# Patient Record
Sex: Female | Born: 1970
Health system: Southern US, Community
[De-identification: ages and names within clinical notes are randomized; demographics above are authoritative.]

## PROBLEM LIST (undated history)

## (undated) ENCOUNTER — Emergency Department (HOSPITAL_BASED_OUTPATIENT_CLINIC_OR_DEPARTMENT_OTHER): Admission: EM | Payer: 59 | Source: Home / Self Care

## (undated) DIAGNOSIS — K219 Gastro-esophageal reflux disease without esophagitis: Secondary | ICD-10-CM

## (undated) DIAGNOSIS — R51 Headache: Secondary | ICD-10-CM

## (undated) DIAGNOSIS — F329 Major depressive disorder, single episode, unspecified: Secondary | ICD-10-CM

## (undated) DIAGNOSIS — T7840XA Allergy, unspecified, initial encounter: Secondary | ICD-10-CM

## (undated) DIAGNOSIS — D649 Anemia, unspecified: Secondary | ICD-10-CM

## (undated) DIAGNOSIS — R7303 Prediabetes: Secondary | ICD-10-CM

## (undated) DIAGNOSIS — F419 Anxiety disorder, unspecified: Secondary | ICD-10-CM

## (undated) DIAGNOSIS — I10 Essential (primary) hypertension: Secondary | ICD-10-CM

## (undated) DIAGNOSIS — F32A Depression, unspecified: Secondary | ICD-10-CM

## (undated) DIAGNOSIS — G4730 Sleep apnea, unspecified: Secondary | ICD-10-CM

## (undated) HISTORY — DX: Allergy, unspecified, initial encounter: T78.40XA

## (undated) HISTORY — DX: Sleep apnea, unspecified: G47.30

## (undated) HISTORY — DX: Essential (primary) hypertension: I10

## (undated) HISTORY — DX: Prediabetes: R73.03

## (undated) HISTORY — DX: Anxiety disorder, unspecified: F41.9

## (undated) HISTORY — DX: Morbid (severe) obesity due to excess calories: E66.01

## (undated) HISTORY — DX: Major depressive disorder, single episode, unspecified: F32.9

## (undated) HISTORY — DX: Depression, unspecified: F32.A

---
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
## 2004-07-20 ENCOUNTER — Other Ambulatory Visit: Admission: RE | Admit: 2004-07-20 | Discharge: 2004-07-20 | Payer: Self-pay | Admitting: Obstetrics and Gynecology

## 2005-07-26 ENCOUNTER — Other Ambulatory Visit: Admission: RE | Admit: 2005-07-26 | Discharge: 2005-07-26 | Payer: Self-pay | Admitting: Obstetrics and Gynecology

## 2012-02-18 ENCOUNTER — Other Ambulatory Visit: Payer: Self-pay | Admitting: Obstetrics and Gynecology

## 2012-02-18 DIAGNOSIS — Z1231 Encounter for screening mammogram for malignant neoplasm of breast: Secondary | ICD-10-CM

## 2012-02-25 ENCOUNTER — Ambulatory Visit
Admission: RE | Admit: 2012-02-25 | Discharge: 2012-02-25 | Disposition: A | Discharge status: Home / Self Care | Payer: 59 | Source: Ambulatory Visit | Attending: Obstetrics and Gynecology | Admitting: Obstetrics and Gynecology

## 2012-02-25 DIAGNOSIS — Z1231 Encounter for screening mammogram for malignant neoplasm of breast: Secondary | ICD-10-CM

## 2012-02-26 ENCOUNTER — Other Ambulatory Visit: Payer: Self-pay | Admitting: Obstetrics and Gynecology

## 2012-02-26 DIAGNOSIS — Z1231 Encounter for screening mammogram for malignant neoplasm of breast: Secondary | ICD-10-CM

## 2012-05-22 ENCOUNTER — Emergency Department (HOSPITAL_BASED_OUTPATIENT_CLINIC_OR_DEPARTMENT_OTHER): Payer: 59

## 2012-05-22 ENCOUNTER — Encounter (HOSPITAL_BASED_OUTPATIENT_CLINIC_OR_DEPARTMENT_OTHER): Payer: Self-pay | Admitting: Student

## 2012-05-22 ENCOUNTER — Emergency Department (HOSPITAL_BASED_OUTPATIENT_CLINIC_OR_DEPARTMENT_OTHER)
Admission: EM | Admit: 2012-05-22 | Discharge: 2012-05-22 | Disposition: A | Discharge status: Home / Self Care | Payer: 59 | Attending: Emergency Medicine | Admitting: Emergency Medicine

## 2012-05-22 DIAGNOSIS — I517 Cardiomegaly: Secondary | ICD-10-CM

## 2012-05-22 DIAGNOSIS — R0602 Shortness of breath: Secondary | ICD-10-CM | POA: Insufficient documentation

## 2012-05-22 DIAGNOSIS — I10 Essential (primary) hypertension: Secondary | ICD-10-CM | POA: Insufficient documentation

## 2012-05-22 LAB — BASIC METABOLIC PANEL
BUN: 8 mg/dL (ref 6–23)
CO2: 29 mEq/L (ref 19–32)
Calcium: 9.3 mg/dL (ref 8.4–10.5)
Creatinine, Ser: 0.9 mg/dL (ref 0.50–1.10)
Glucose, Bld: 95 mg/dL (ref 70–99)

## 2012-05-22 LAB — CBC WITH DIFFERENTIAL/PLATELET
Eosinophils Relative: 1 % (ref 0–5)
HCT: 36.1 % (ref 36.0–46.0)
Lymphocytes Relative: 39 % (ref 12–46)
Lymphs Abs: 3.4 10*3/uL (ref 0.7–4.0)
MCV: 86.4 fL (ref 78.0–100.0)
Monocytes Absolute: 0.5 10*3/uL (ref 0.1–1.0)
RBC: 4.18 MIL/uL (ref 3.87–5.11)
RDW: 14.6 % (ref 11.5–15.5)
WBC: 8.5 10*3/uL (ref 4.0–10.5)

## 2012-05-22 MED ORDER — LISINOPRIL 10 MG PO TABS: 10.0000 mg | ORAL_TABLET | Freq: Every day | ORAL | Status: DC

## 2012-05-22 NOTE — ED Notes (Signed)
Patient transported to X-ray 

## 2012-05-22 NOTE — ED Provider Notes (Signed)
Medical screening examination/treatment/procedure(s) were performed by non-physician practitioner and as supervising physician I was immediately available for consultation/collaboration.  Sharika Mosquera T Jahmar Mckelvy, MD 05/22/12 2220 

## 2012-05-22 NOTE — ED Provider Notes (Signed)
History     CSN: [id number]  Arrival date & time 05/22/12  1617   First MD Initiated Contact with Patient 05/22/12 1724      Chief Complaint  Patient presents with  . Shortness of Breath  . Cough  . Hypertension    (Consider location/radiation/quality/duration/timing/severity/associated sxs/prior treatment) HPI Comments: Pt states that she has had a cough in the last couple of days  Patient is a 41 y.o. female presenting with shortness of breath. The history is provided by the patient. No language interpreter was used.  Shortness of Breath  The current episode started more than 2 weeks ago. The onset was gradual. The problem occurs continuously. The problem has been gradually worsening. The problem is moderate. Nothing relieves the symptoms. The symptoms are aggravated by activity. Pertinent negatives include no chest pressure.    History reviewed. No pertinent past medical history.  Past Surgical History  Procedure Date  . Dilation and curettage of uterus     History reviewed. No pertinent family history.  History  Substance Use Topics  . Smoking status: Never Smoker   . Smokeless tobacco: Not on file  . Alcohol Use: No    OB History    Grav Para Term Preterm Abortions TAB SAB Ect Mult Living                  Review of Systems  Constitutional: Negative.   Respiratory: Negative.   Cardiovascular: Negative.     Allergies  Azithromycin  Home Medications  No current outpatient prescriptions on file.  BP 176/116  Pulse 93  Temp 99.2 F (37.3 C) (Oral)  Resp 20  Ht 5\' 1"  (1.549 m)  Wt 260 lb (117.935 kg)  BMI 49.13 kg/m2  SpO2 100%  LMP 05/08/2012  Physical Exam  Nursing note and vitals reviewed. Constitutional: She is oriented to person, place, and time. She appears well-developed and well-nourished.  Cardiovascular: Normal rate and regular rhythm.   Pulmonary/Chest: Breath sounds normal.  Abdominal: Soft. Bowel sounds are normal.    Musculoskeletal: Normal range of motion.  Neurological: She is alert and oriented to person, place, and time.    ED Course  Procedures (including critical care time)  Labs Reviewed  CBC WITH DIFFERENTIAL - Abnormal; Notable for the following:    Hemoglobin 11.3 (*)     All other components within normal limits  BASIC METABOLIC PANEL - Abnormal; Notable for the following:    GFR calc non Af Amer 79 (*)     All other components within normal limits  D-DIMER, QUANTITATIVE  TROPONIN I   Dg Chest 2 View  05/22/2012  *RADIOLOGY REPORT*  Clinical Data: Cough, shortness of breath, hypertension.  CHEST - 2 VIEW  Comparison: None.  Findings: Obesity.  Low lung volumes, with resultant crowding of bronchovascular markings.  No confluent airspace infiltrate or overt edema.  Heart size upper limits normal.  IMPRESSION:  1.  Borderline cardiomegaly   Original Report Authenticated By: D. Andria Rhein, MD     Date: 05/22/2012  Rate: 82  Rhythm: normal sinus rhythm and sinus arrhythmia  QRS Axis: normal  Intervals: normal  ST/T Wave abnormalities: normal  Conduction Disutrbances:none  Narrative Interpretation:   Old EKG Reviewed: none available    1. HTN (hypertension)       MDM  Pt started on lisinopril:pt not having any cp:not actively sob:pt given referral to set up for pcp        Teressa Lower, NP 05/22/12 1935

## 2012-05-22 NOTE — ED Notes (Signed)
Shortness of breath, cough, hypertension

## 2012-06-16 ENCOUNTER — Encounter: Payer: Self-pay | Admitting: Internal Medicine

## 2012-06-16 ENCOUNTER — Ambulatory Visit (INDEPENDENT_AMBULATORY_CARE_PROVIDER_SITE_OTHER): Payer: 59 | Admitting: Internal Medicine

## 2012-06-16 VITALS — BP 138/90 | HR 78 | Temp 98.5°F | Resp 16 | Ht 62.0 in | Wt 265.0 lb

## 2012-06-16 DIAGNOSIS — I10 Essential (primary) hypertension: Secondary | ICD-10-CM

## 2012-06-16 DIAGNOSIS — I499 Cardiac arrhythmia, unspecified: Secondary | ICD-10-CM | POA: Insufficient documentation

## 2012-06-16 DIAGNOSIS — R0609 Other forms of dyspnea: Secondary | ICD-10-CM

## 2012-06-16 DIAGNOSIS — G479 Sleep disorder, unspecified: Secondary | ICD-10-CM

## 2012-06-16 DIAGNOSIS — G4733 Obstructive sleep apnea (adult) (pediatric): Secondary | ICD-10-CM

## 2012-06-16 DIAGNOSIS — R0683 Snoring: Secondary | ICD-10-CM

## 2012-06-16 DIAGNOSIS — I495 Sick sinus syndrome: Secondary | ICD-10-CM

## 2012-06-16 NOTE — Progress Notes (Signed)
  Subjective:    Patient ID: Robin Cox, female    DOB: [birth year], 42 y.o.   MRN: [id number]  HPI  Oliver is a new pt here for first visit.   PMH of uterine fibroid, morbid obesity, and  hypertension recently started on Lisinopril  Pt not sure of dose. She reports she has been told in the past that her BP was high but "I was denial"   She was seen in ER at [facility]  See note.  CXR at that time show borderline cardiomegaly  She is  Concerned over possibilityof OSA.  She knows she snores and family members have told her that she stops breathing at night .  She also reports she hears herself wheezing at times  She has DOE since October of 2013.  No PND no LE edema  No chest pain, pressure or indigestion.    Allergies  Allergen Reactions  . Azithromycin    Past Medical History  Diagnosis Date  . Hypertension    Past Surgical History  Procedure Date  . Dilation and curettage of uterus    History   Social History  . Marital Status: Single    Spouse Name: N/A    Number of Children: N/A  . Years of Education: N/A   Occupational History  . Not on file.   Social History Main Topics  . Smoking status: Never Smoker   . Smokeless tobacco: Not on file  . Alcohol Use: No  . Drug Use: No  . Sexually Active: No   Other Topics Concern  . Not on file   Social History Narrative  . No narrative on file   Family History  Problem Relation Age of Onset  . Hypertension Brother   . Cancer Paternal Aunt     breast cancer  . Cancer Maternal Grandmother     pancreatic   There is no problem list on file for this patient.  Current Outpatient Prescriptions on File Prior to Visit  Medication Sig Dispense Refill  . lisinopril (PRINIVIL,ZESTRIL) 10 MG tablet Take 1 tablet (10 mg total) by mouth daily.  30 tablet  0      Review of Systems    see HPI Objective:   Physical Exam Physical Exam  Nursing note and vitals reviewed. Peak flow in office 500 Constitutional: She is oriented  to person, place, and time. She appears well-developed and well-nourished.  HENT:  Head: Normocephalic and atraumatic.  Cardiovascular: Normal rate and regular rhythm. Exam reveals no gallop and no friction rub.  No murmur heard.  Pulmonary/Chest: Breath sounds normal. She has no wheezes. She has no rales.  Neurological: She is alert and oriented to person, place, and time.  Skin: Skin is warm and dry.  Psychiatric: She has a normal mood and affect. Her behavior is normal.              Assessment & Plan:  HTN  Will continue current med  Check labs today.  EKG shows sinus arrythmia  Snoring sleep disturbance  Will refer to Pulmonary  Dr. Vassie Loll  Morbid obesity   Uterine fibroids  Schedule CPE and follow up with me in 4 weeks

## 2012-06-19 ENCOUNTER — Telehealth: Payer: Self-pay | Admitting: Internal Medicine

## 2012-06-19 NOTE — Telephone Encounter (Signed)
Patient was seen on Monday and needed to know if she was to continue on her lisinopril 10mg .  If so please send to [facility], [address] [place]in[place] Otto,   Call back number for patient is [phone].  wb

## 2012-06-19 NOTE — Telephone Encounter (Signed)
Robin Cox  Check with pharmacy and get correct dose of med.  OK to call in #[phone] with 1 refill

## 2012-06-20 ENCOUNTER — Other Ambulatory Visit: Payer: Self-pay | Admitting: *Deleted

## 2012-07-02 ENCOUNTER — Ambulatory Visit (INDEPENDENT_AMBULATORY_CARE_PROVIDER_SITE_OTHER): Payer: 59 | Admitting: Pulmonary Disease

## 2012-07-02 ENCOUNTER — Encounter: Payer: Self-pay | Admitting: Pulmonary Disease

## 2012-07-02 VITALS — BP 120/92 | HR 118 | Temp 99.2°F | Ht 62.0 in | Wt 273.0 lb

## 2012-07-02 DIAGNOSIS — R0989 Other specified symptoms and signs involving the circulatory and respiratory systems: Secondary | ICD-10-CM

## 2012-07-02 DIAGNOSIS — I10 Essential (primary) hypertension: Secondary | ICD-10-CM

## 2012-07-02 DIAGNOSIS — R0683 Snoring: Secondary | ICD-10-CM

## 2012-07-02 DIAGNOSIS — R0609 Other forms of dyspnea: Secondary | ICD-10-CM

## 2012-07-02 MED ORDER — OLMESARTAN MEDOXOMIL 20 MG PO TABS: 20.0000 mg | ORAL_TABLET | Freq: Every day | ORAL | Status: DC

## 2012-07-02 NOTE — Progress Notes (Signed)
Subjective:    Patient ID: Robin Cox, female    DOB: December 27, 1970, 42 y.o.   MRN: [id number]  HPI 42 year old this woman for evaluation of obstructive sleep apnea and cough. 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 She has been working the third shift for at least 15 years Per pt her family member has told her she stops breathing during sleep and she snores. Epworth sleepiness score is 9/24  She works from 9:30 PM to 6 AM and is also finishing school about 2-3 times a week. Bedtime is between 1 to 2 PM, sleep latency is about an hour, she sleeps on her side with one pillow. She is 2-3 awakenings including bathroom visit. She is out of bed by 6 PM with dryness of mouth feeling tired but denies headaches. On weekends she goes back to her likely sleep schedule of bedtime and 1 AM and is up around 8 AM she does nap on weekends. She's gained about 50 pounds in the last 2 years. She's tried to cut down on soda for the last 2 weeks and drinks one glass of tea daily. There is no history suggestive of cataplexy, sleep paralysis or parasomnias  Shows a reports dyspnea on exertion for 6 months. She had an ER visit in December 2013 for hypertensive crisis with a blood pressure 170/110. She was started on the supple 10 mg and good control of blood pressure but developed a dry cough with constant throat clearing in the last month. She also reports wheezing both at rest and on exertion. And for the last 2 months she has developed mild bipedal edema. Chest x-ray showed cardiomegaly without pulmonary vascular congestion or infiltrates  Past Medical History  Diagnosis Date  . Hypertension   . Allergy     Past Surgical History  Procedure Date  . Dilation and curettage of uterus     Allergies  Allergen Reactions  . Azithromycin    Family History  Problem Relation Age of Onset  . Hypertension Brother   . Breast cancer Paternal Aunt     breast cancer  . Pancreatic cancer Maternal  Grandmother     pancreatic  . Allergies Sister   . Allergies Brother     History   Social History  . Marital Status: Single    Spouse Name: N/A    Number of Children: N/A  . Years of Education: N/A   Occupational History  . Not on file.   Social History Main Topics  . Smoking status: Never Smoker   . Smokeless tobacco: Not on file  . Alcohol Use: No  . Drug Use: No  . Sexually Active: No   Other Topics Concern  . Not on file   Social History Narrative  . No narrative on file     Review of Systems  Constitutional: Negative for appetite change and unexpected weight change.  HENT: Positive for ear pain, congestion and sneezing. Negative for sore throat and trouble swallowing.   Respiratory: Positive for cough and shortness of breath.   Cardiovascular: Positive for palpitations and leg swelling. Negative for chest pain.  Gastrointestinal: Negative for abdominal pain.  Musculoskeletal: Negative for joint swelling.  Skin: Negative for rash.  Neurological: Negative for headaches.  Psychiatric/Behavioral: Negative for dysphoric mood. The patient is not nervous/anxious.        Objective:   Physical Exam  Gen. Pleasant, obese, in no distress, normal affect ENT - no lesions, no post nasal drip, class  2-3 airway Neck: No JVD, no thyromegaly, no carotid bruits Lungs: no use of accessory muscles, no dullness to percussion, decreased without rales or rhonchi  Cardiovascular: Rhythm regular, heart sounds  normal, no murmurs or gallops, no peripheral edema Abdomen: soft and non-tender, no hepatosplenomegaly, BS normal. Musculoskeletal: No deformities, no cyanosis or clubbing Neuro:  alert, non focal, no tremors       Assessment & Plan:

## 2012-07-02 NOTE — Patient Instructions (Signed)
Stop taking lisinopril - your cough is related to this Take benicar 20 mg instead - cough can take 6 weeks to subside Home sleep study If positive, will proceed with CPAP titration study in the lab

## 2012-07-02 NOTE — Assessment & Plan Note (Signed)
Stop taking lisinopril - your cough is related to this Take benicar 20 mg instead - cough can take 6 weeks to subside Unclear if dyspnea is also related to upper airway issues or simply deconditioning - doubt asthma, no risk factors for VTE

## 2012-07-02 NOTE — Assessment & Plan Note (Signed)
Given excessive daytime somnolence, narrow pharyngeal exam, witnessed apneas & loud snoring, obstructive sleep apnea is very likely & an overnight polysomnogram will be scheduled as a split study. The pathophysiology of obstructive sleep apnea , it's cardiovascular consequences & modes of treatment including CPAP were discused with the patient in detail & they evidenced understanding.   Home sleep study If positive, will proceed with CPAP titration study in the lab

## 2012-07-04 ENCOUNTER — Encounter: Payer: Self-pay | Admitting: *Deleted

## 2012-07-09 ENCOUNTER — Ambulatory Visit (INDEPENDENT_AMBULATORY_CARE_PROVIDER_SITE_OTHER): Payer: 59 | Admitting: Internal Medicine

## 2012-07-09 ENCOUNTER — Encounter: Payer: Self-pay | Admitting: Internal Medicine

## 2012-07-09 VITALS — BP 148/90 | HR 86 | Temp 97.4°F | Resp 18 | Ht 61.5 in | Wt 270.0 lb

## 2012-07-09 DIAGNOSIS — I10 Essential (primary) hypertension: Secondary | ICD-10-CM

## 2012-07-09 DIAGNOSIS — G4733 Obstructive sleep apnea (adult) (pediatric): Secondary | ICD-10-CM

## 2012-07-09 MED ORDER — OLMESARTAN MEDOXOMIL 20 MG PO TABS: 20.0000 mg | ORAL_TABLET | Freq: Every day | ORAL | Status: DC

## 2012-07-09 NOTE — Patient Instructions (Addendum)
See me in 3 weeks

## 2012-07-09 NOTE — Progress Notes (Signed)
  Subjective:    Patient ID: Robin Cox, female    DOB: 12-20-1970, 42 y.o.   MRN: [id number]  HPI Robin Cox is here for follow up.  She has a sleep study pending and has recently discontinued ACE inhibitor and switched to Benicar 20 mg.  She has been on Benicar for 3-4 days.  States cough is improving.   See BP  She is asymptomatic  Labs inclluding lipids done at [facility] look good  See scanned report  Allergies  Allergen Reactions  . Azithromycin    Past Medical History  Diagnosis Date  . Hypertension   . Allergy    Past Surgical History  Procedure Laterality Date  . Dilation and curettage of uterus     History   Social History  . Marital Status: Single    Spouse Name: N/A    Number of Children: N/A  . Years of Education: N/A   Occupational History  . Not on file.   Social History Main Topics  . Smoking status: Never Smoker   . Smokeless tobacco: Not on file  . Alcohol Use: No  . Drug Use: No  . Sexually Active: No   Other Topics Concern  . Not on file   Social History Narrative  . No narrative on file   Family History  Problem Relation Age of Onset  . Hypertension Brother   . Breast cancer Paternal Aunt     breast cancer  . Pancreatic cancer Maternal Grandmother     pancreatic  . Allergies Sister   . Allergies Brother    Patient Active Problem List  Diagnosis  . Essential hypertension, benign  . Arrhythmia, sinus node  . Morbid obesity  . OSA (obstructive sleep apnea)   Current Outpatient Prescriptions on File Prior to Visit  Medication Sig Dispense Refill  . olmesartan (BENICAR) 20 MG tablet Take 1 tablet (20 mg total) by mouth daily.  7 tablet  4   No current facility-administered medications on file prior to visit.       Review of Systems See HPI    Objective:   Physical Exam Physical Exam  Nursing note and vitals reviewed.  Constitutional: She is oriented to person, place, and time. She appears well-developed and well-nourished.   HENT:  Head: Normocephalic and atraumatic.  Cardiovascular: Normal rate and regular rhythm. Exam reveals no gallop and no friction rub.  No murmur heard.  Pulmonary/Chest: Breath sounds normal. She has no wheezes. She has no rales.  Neurological: She is alert and oriented to person, place, and time.  Skin: Skin is warm and dry.  Psychiatric: She has a normal mood and affect. Her behavior is normal.             Assessment & Plan:  HTN:  She has only been on Benicar 4 days.  Pt is to check outpt bp and bring log to me in 3 weeks.  If not at goal will add diuretic at that time  Morbid obesity  Does not want referral now  OSA  Sleep study pending  See me in 3 weeks

## 2012-07-10 ENCOUNTER — Ambulatory Visit: Payer: 59 | Admitting: Internal Medicine

## 2012-07-15 ENCOUNTER — Other Ambulatory Visit: Payer: Self-pay | Admitting: Pulmonary Disease

## 2012-07-15 DIAGNOSIS — G4733 Obstructive sleep apnea (adult) (pediatric): Secondary | ICD-10-CM

## 2012-07-17 ENCOUNTER — Telehealth: Payer: Self-pay | Admitting: *Deleted

## 2012-07-17 NOTE — Telephone Encounter (Signed)
Called concerned about RX

## 2012-07-22 ENCOUNTER — Institutional Professional Consult (permissible substitution): Payer: 59 | Admitting: Pulmonary Disease

## 2012-07-31 ENCOUNTER — Ambulatory Visit (INDEPENDENT_AMBULATORY_CARE_PROVIDER_SITE_OTHER): Payer: 59 | Admitting: Internal Medicine

## 2012-07-31 ENCOUNTER — Telehealth: Payer: Self-pay | Admitting: *Deleted

## 2012-07-31 ENCOUNTER — Encounter: Payer: Self-pay | Admitting: Internal Medicine

## 2012-07-31 VITALS — BP 130/80 | HR 82 | Temp 97.4°F | Resp 16 | Ht 61.0 in | Wt 266.0 lb

## 2012-07-31 DIAGNOSIS — I10 Essential (primary) hypertension: Secondary | ICD-10-CM

## 2012-07-31 DIAGNOSIS — G4733 Obstructive sleep apnea (adult) (pediatric): Secondary | ICD-10-CM

## 2012-07-31 NOTE — Progress Notes (Signed)
  Subjective:    Patient ID: Robin Cox, female    DOB: [birth year], 42 y.o.   MRN: [id number]  HPI [name] returns after initiating benicar.   States headaches are gone  No chest pain,  No cough no edema  Sleep study pending  Allergies  Allergen Reactions  . Azithromycin    Past Medical History  Diagnosis Date  . Hypertension   . Allergy    Past Surgical History  Procedure Laterality Date  . Dilation and curettage of uterus     History   Social History  . Marital Status: Single    Spouse Name: N/A    Number of Children: N/A  . Years of Education: N/A   Occupational History  . Not on file.   Social History Main Topics  . Smoking status: Never Smoker   . Smokeless tobacco: Not on file  . Alcohol Use: No  . Drug Use: No  . Sexually Active: No   Other Topics Concern  . Not on file   Social History Narrative  . No narrative on file   Family History  Problem Relation Age of Onset  . Hypertension Brother   . Breast cancer Paternal Aunt     breast cancer  . Pancreatic cancer Maternal Grandmother     pancreatic  . Allergies Sister   . Allergies Brother    Patient Active Problem List  Diagnosis  . Essential hypertension, benign  . Arrhythmia, sinus node  . Morbid obesity  . OSA (obstructive sleep apnea)   Current Outpatient Prescriptions on File Prior to Visit  Medication Sig Dispense Refill  . olmesartan (BENICAR) 20 MG tablet Take 1 tablet (20 mg total) by mouth daily.  30 tablet  1   No current facility-administered medications on file prior to visit.      Review of Systems    see HPI Objective:   Physical Exam  Physical Exam  Nursing note and vitals reviewed.  Constitutional: She is oriented to person, place, and time. She appears well-developed and well-nourished.  HENT:  Head: Normocephalic and atraumatic.  Cardiovascular: Normal rate and regular rhythm. Exam reveals no gallop and no friction rub.  No murmur heard.  Pulmonary/Chest:  Breath sounds normal. She has no wheezes. She has no rales.  Neurological: She is alert and oriented to person, place, and time.  Skin: Skin is warm and dry.  Psychiatric: She has a normal mood and affect. Her behavior is normal.  Ext  No edema       Assessment & Plan:  HTN:  Well controlled on Benicar 20 mg  OSA  Sleep study pending  Morbid obesity  Counseled regarding diet.   Does not want nutritional referral as yet

## 2012-07-31 NOTE — Patient Instructions (Addendum)
Keep appt with me For CPe

## 2012-08-03 ENCOUNTER — Encounter (HOSPITAL_BASED_OUTPATIENT_CLINIC_OR_DEPARTMENT_OTHER): Payer: 59

## 2012-08-04 NOTE — Telephone Encounter (Signed)
error 

## 2012-08-06 ENCOUNTER — Telehealth (HOSPITAL_BASED_OUTPATIENT_CLINIC_OR_DEPARTMENT_OTHER): Payer: Self-pay | Admitting: Radiology

## 2012-08-06 ENCOUNTER — Ambulatory Visit (HOSPITAL_BASED_OUTPATIENT_CLINIC_OR_DEPARTMENT_OTHER): Payer: 59

## 2012-08-06 NOTE — Telephone Encounter (Signed)
The technologist spoke with Dr. Vassie Loll regarding Robin Cox. [facility]/o nasal congestion and wheezing.At this time the patient was advised to reschedule the appointment due to illness. After speaking with Dr. Vassie Loll, he asked that the tech call and speak with [facility] tri[facility] to have the nurse give [name] a follow-up call regarding the concerns. The tech spoke with Victorino Dike at [facility] triage and she indicated that she would give Ms. call at the request of Dr. Vassie Loll...vlb

## 2012-09-04 ENCOUNTER — Ambulatory Visit (INDEPENDENT_AMBULATORY_CARE_PROVIDER_SITE_OTHER): Payer: 59 | Admitting: General Surgery

## 2012-09-05 ENCOUNTER — Telehealth: Payer: Self-pay | Admitting: Internal Medicine

## 2012-09-05 MED ORDER — OLMESARTAN MEDOXOMIL 20 MG PO TABS: 20.0000 mg | ORAL_TABLET | Freq: Every day | ORAL | Status: DC

## 2012-09-05 NOTE — Telephone Encounter (Signed)
benicar sent to [facility]

## 2012-09-05 NOTE — Telephone Encounter (Signed)
Pt states needs her olmesartan (BENICAR) 20 MG tablet Sent to [facility] out patient [facility]... She states she will be out of meds on Sunday... Pt was advise to make it 24 to 48 hours.. pts number [phone]

## 2012-09-07 ENCOUNTER — Ambulatory Visit (HOSPITAL_BASED_OUTPATIENT_CLINIC_OR_DEPARTMENT_OTHER): Payer: 59 | Attending: Pulmonary Disease

## 2012-09-07 VITALS — Ht 62.0 in | Wt 263.0 lb

## 2012-09-07 DIAGNOSIS — G4733 Obstructive sleep apnea (adult) (pediatric): Secondary | ICD-10-CM | POA: Insufficient documentation

## 2012-09-07 DIAGNOSIS — Z9989 Dependence on other enabling machines and devices: Secondary | ICD-10-CM

## 2012-09-10 ENCOUNTER — Telehealth: Payer: Self-pay | Admitting: Pulmonary Disease

## 2012-09-10 DIAGNOSIS — G4730 Sleep apnea, unspecified: Secondary | ICD-10-CM

## 2012-09-10 DIAGNOSIS — G4710 Hypersomnia, unspecified: Secondary | ICD-10-CM

## 2012-09-10 DIAGNOSIS — G4733 Obstructive sleep apnea (adult) (pediatric): Secondary | ICD-10-CM

## 2012-09-10 NOTE — Telephone Encounter (Signed)
PSG showed severe OSA I saw that she did not tolerate CPAP very well in th elab. I would like her to try this at home If agreeable, start on autoCPAP 8-13cm, med FF mask, humidity, download in 4 wks

## 2012-09-11 ENCOUNTER — Encounter: Payer: Self-pay | Admitting: Internal Medicine

## 2012-09-11 ENCOUNTER — Ambulatory Visit (INDEPENDENT_AMBULATORY_CARE_PROVIDER_SITE_OTHER): Payer: 59 | Admitting: Internal Medicine

## 2012-09-11 ENCOUNTER — Ambulatory Visit (INDEPENDENT_AMBULATORY_CARE_PROVIDER_SITE_OTHER): Payer: 59 | Admitting: General Surgery

## 2012-09-11 VITALS — BP 153/98 | HR 88 | Temp 98.3°F | Resp 18 | Ht 62.0 in | Wt 274.0 lb

## 2012-09-11 DIAGNOSIS — I10 Essential (primary) hypertension: Secondary | ICD-10-CM

## 2012-09-11 DIAGNOSIS — L03116 Cellulitis of left lower limb: Secondary | ICD-10-CM

## 2012-09-11 DIAGNOSIS — L02419 Cutaneous abscess of limb, unspecified: Secondary | ICD-10-CM

## 2012-09-11 DIAGNOSIS — R609 Edema, unspecified: Secondary | ICD-10-CM

## 2012-09-11 DIAGNOSIS — R600 Localized edema: Secondary | ICD-10-CM

## 2012-09-11 MED ORDER — CEFTRIAXONE SODIUM 1 G IJ SOLR
1.0000 g | INTRAMUSCULAR | Status: DC
  Administered 2012-09-11: 1 g via INTRAMUSCULAR

## 2012-09-11 MED ORDER — ALBUTEROL SULFATE (5 MG/ML) 0.5% IN NEBU: 5.0000 mg | INHALATION_SOLUTION | Freq: Once | RESPIRATORY_TRACT | Status: DC

## 2012-09-11 MED ORDER — AMOXICILLIN-POT CLAVULANATE 500-125 MG PO TABS: 1.0000 | ORAL_TABLET | Freq: Three times a day (TID) | ORAL | Status: DC

## 2012-09-11 MED ORDER — HYDROCHLOROTHIAZIDE 12.5 MG PO TABS: 25.0000 mg | ORAL_TABLET | Freq: Every day | ORAL | Status: DC

## 2012-09-11 NOTE — Progress Notes (Signed)
Subjective:    Patient ID: Robin Cox, female    DOB: Sep 08, 1970, 42 y.o.   MRN: [id number]  HPI  Robin Cox is here for CPE.    She had recent sleep study revealing sever OSA.   She will be initiating CPAP soon.    She tells me her cough has resumed and she is wheezing with exertion.  Cough is dry.  CXR  Shows borderline cardiomegaly  She has had evaluation at bariatric center and will be undergoing surgery in the future.    HTN  On BEnicar  Pt reports aprox 4 week history of redness and warmth of LLE.  Both leg swell but left worse than right.  She did not seek medical attention in last 4 weeks.  Allergies  Allergen Reactions  . Azithromycin    Past Medical History  Diagnosis Date  . Hypertension   . Allergy    Past Surgical History  Procedure Laterality Date  . Dilation and curettage of uterus     History   Social History  . Marital Status: Single    Spouse Name: N/A    Number of Children: N/A  . Years of Education: N/A   Occupational History  . Not on file.   Social History Main Topics  . Smoking status: Never Smoker   . Smokeless tobacco: Not on file  . Alcohol Use: No  . Drug Use: No  . Sexually Active: No   Other Topics Concern  . Not on file   Social History Narrative  . No narrative on file   Family History  Problem Relation Age of Onset  . Hypertension Brother   . Breast cancer Paternal Aunt     breast cancer  . Pancreatic cancer Maternal Grandmother     pancreatic  . Allergies Sister   . Allergies Brother    Patient Active Problem List  Diagnosis  . Essential hypertension, benign  . Arrhythmia, sinus node  . Morbid obesity  . OSA (obstructive sleep apnea)   Current Outpatient Prescriptions on File Prior to Visit  Medication Sig Dispense Refill  . olmesartan (BENICAR) 20 MG tablet Take 1 tablet (20 mg total) by mouth daily.  30 tablet  1   No current facility-administered medications on file prior to visit.      Review of  Systems  Respiratory: Positive for cough and shortness of breath.   Skin: Positive for color change and rash.       Objective:   Physical Exam Physical Exam  Physical Exam  Nursing note and vitals reviewed.  Constitutional: She is oriented to person, place, and time. She appears well-developed and well-nourished.  HENT:  Head: Normocephalic and atraumatic.  Right Ear: Tympanic membrane and ear canal normal. No drainage. Tympanic membrane is not injected and not erythematous.  Left Ear: Tympanic membrane and ear canal normal. No drainage. Tympanic membrane is not injected and not erythematous.  Nose: Nose normal. Right sinus exhibits no maxillary sinus tenderness and no frontal sinus tenderness. Left sinus exhibits no maxillary sinus tenderness and no frontal sinus tenderness.  Mouth/Throat: Oropharynx is clear and moist. No oral lesions. No oropharyngeal exudate.  Eyes: Conjunctivae and EOM are normal. Pupils are equal, round, and reactive to light.  Neck: Normal range of motion. Neck supple. No JVD present. Carotid bruit is not present. No mass and no thyromegaly present.  Cardiovascular: Normal rate, regular rhythm, S1 normal, S2 normal and intact distal pulses. Exam reveals no gallop and no friction  rub.  No murmur heard.  Pulses:  Carotid pulses are 2+ on the right side, and 2+ on the left side.  Dorsalis pedis pulses are 2+ on the right side, and 2+ on the left side.  No carotid bruit. No LE edema  Pulmonary/Chest: Breath sounds normal. She has no wheezes. She has no rales. She exhibits no tenderness.  Abdominal: Soft. Bowel sounds are normal. She exhibits no distension and no mass. There is no hepatosplenomegaly. There is no tenderness. There is no CVA tenderness.  Musculoskeletal: Normal range of motion.  No active synovitis to joints.  Lymphadenopathy:  She has no cervical adenopathy.  She has no axillary adenopathy.  Right: No inguinal and no supraclavicular adenopathy  present.  Left: No inguinal and no supraclavicular adenopathy present.  Neurological: She is alert and oriented to person, place, and time. She has normal strength and normal reflexes. She displays no tremor. No cranial nerve deficit or sensory deficit. Coordination and gait normal.  Skin: Skin is warm and dry. No rash noted. No cyanosis. Nails show no clubbing.  Ext:  L leg warm, erythematous  Consistent with cellulitis.  Good bilateral pedal pulses.  Edema worse on Left Psychiatric: She has a normal mood and affect. Her speech is normal and behavior is normal. Cognition and memory are normal.      Assessment & Plan:  Health Maintenance:    See scanned sheet.  Recent pap at GYN office.    HTN  Continue Benicar.  See below will add low dose HCTZ  LLE cellulitis  Will give Rocephin 1 gm in office today and Augmentin tid for 10 day course.  See me in office next week for check.  Bilateral LE edema  Will place on low dose HCTZ  SOB.  In setting of LE edema  Will check bilateral venous ultrasound although clinical suspicion higher for cellulitis.    Cough/bronchospasmL    has been chronic and predates  Benicar initiation .  Likely multifactorial morbid obesity certainly a contributor.  With cardiomegaly on CXR will discuss ECHO/EF eval. At her return visit next week.  Bronchospasm  much improved with Albuterel HHN in office.  Will give albuterol MDI that she can use bid, tid as outpt.   Severe OSA:   Pending treatment with CPAP  Dr. Vassie Loll   Morbid obesity.  P undergoing eval for gastric sleeve  Allergic rhinitis  Addendum:  Verbal report Friday am 4/18 venous ultrasound negative.         Assessment & Plan:

## 2012-09-11 NOTE — Procedures (Signed)
NAMEEVONNE, RINKS               ACCOUNT NO.:  [id number]  MEDICAL RECORD NO.:  [id number]          PATIENT TYPE:  OUT  LOCATION:  SLEEP CENTER                 FACILITY:  [facility]  PHYSICIAN:  Oretha Milch, MD      DATE OF BIRTH:  1970-07-06  DATE OF STUDY:  09/07/2012                           NOCTURNAL POLYSOMNOGRAM  REFERRING PHYSICIAN:  Oretha Milch, MD  INDICATION FOR STUDY:  Ms. Rarick is a 42 year old woman with hypertension, loud snoring, excessive daytime somnolence, trouble concentrating, and restless sleep.  At the time of this study, she weighed 263 pounds with a height of 5 feet 2 inches, BMI of 48, neck size of 17 inches.  Epworth sleepiness score of 11.  This intervention polysomnogram was performed with sleep technologist in attendance.  EEG, EOG, EMG, EKG, and respiratory parameters were recorded.  Sleep stages, arousals, limb movements, and respiratory data were scored according to criteria laid out by the American academy of Sleep Medicine.  SLEEP ARCHITECTURE:  Lights out was at 10:44 p.m.; lights on was at 5:15 a.m.  CPAP was initiated at 2:31 a.m.  During the diagnostic portion, total sleep time was 121 minutes with a sleep period time of 162 minutes with a sleep latency of sleep 65 minutes and sleep efficiency of 54%. Wake after sleep onset was 40 minutes.  Latency to REM sleep was 117 minutes.  Sleep stages as a percentage of total sleep time was N1 18%, N2 71%, N3 0%, REM sleep 11% (13 minutes).  Supine sleep was not noted. During the titration portion, REM sleep or supine sleep was not noted.  RESPIRATORY DATA:  During the diagnostic portion, there were 226 obstructive apneas, 0 central apneas, 0 mixed apneas, and 52 hypopneas with an apnea-hypopnea index of 137 events per hour and the lowest desaturation of 82%.  Due to this degree of respiratory disturbance, CPAP was initiated at 5 cm and titrated up only to a final level of 13 cm with a medium full  face mask.  She did not tolerate CPAP very well. At a level of 13 cm for 49 minutes, four obstructive apneas were noted with an AHI of 9 events per hour.  This appears to be the optimal level used during the study.  Although once again this was not well tolerated due to panic situation.  In the fact CPAP had to be decreased to 6 cm to allow her to go back to sleep and she slept propped up with 3 pillows.  LIMB MOVEMENT DATA:  No significant limb movements were noted.  AROUSAL DATA:  The arousal index was 1 events per hour during the diagnostic portion.  OXYGEN SATURATION DATA:  The lowest desaturation was 82%.  During the diagnostic portion of titration, the desaturation index was 1.6 events per hour and she spent 0 minutes with a saturation less than 88%.  CARDIAC DATA:  The low heart rate was 50 beats per minute.  The high heart rate recorded was an artifact.  No arrhythmias were noted.  IMPRESSION: 1. Severe obstructive sleep apnea with hypopneas causing sleep     fragmentation and oxygen desaturation. 2. This was corrected by  CPAP of 13 cm with a medium full face mask.     However, CPAP mask was poorly tolerated due to panic attacks. 3. Evidence of cardiac arrhythmias, limb movements, or behavioral     disturbance during sleep.  RECOMMENDATIONS: 1. She would benefit from a CPAP desensitization session. 2. Treatment options at this degree of sleep-disordered breathing     include weight loss and CPAP therapy. 3. CPAP should be initiated at 13 cm with a medium full face mask     alternatively.  Auto CPAP can be tried till she is able to adjust     with CPAP.  Compliance should be monitored at this level. 4. She should be advised against driving when sleepy.  She should be     cautioned against medications with sedative side effects.     Oretha Milch, MD    RVA/MEDQ  D:  09/10/2012 12:58:59  T:  09/11/2012 16:10:96  Job:  [id number]

## 2012-09-11 NOTE — Patient Instructions (Addendum)
Take HCTZ diuretic once a day  Use Albuterol inhaler 2 inhalations 3 times a day  Take antibiotic Augmentin 3 times a day    See me in one week or sooner if leg is worse  OK to use plain Claritin 24 hour .  One pill a day  for allergies  To go to lab today

## 2012-09-12 ENCOUNTER — Ambulatory Visit (HOSPITAL_BASED_OUTPATIENT_CLINIC_OR_DEPARTMENT_OTHER)
Admission: RE | Admit: 2012-09-12 | Discharge: 2012-09-12 | Disposition: A | Discharge status: Home / Self Care | Payer: 59 | Source: Ambulatory Visit | Attending: Internal Medicine | Admitting: Internal Medicine

## 2012-09-12 DIAGNOSIS — I10 Essential (primary) hypertension: Secondary | ICD-10-CM | POA: Insufficient documentation

## 2012-09-12 DIAGNOSIS — M79609 Pain in unspecified limb: Secondary | ICD-10-CM | POA: Insufficient documentation

## 2012-09-12 DIAGNOSIS — R609 Edema, unspecified: Secondary | ICD-10-CM | POA: Insufficient documentation

## 2012-09-12 DIAGNOSIS — E669 Obesity, unspecified: Secondary | ICD-10-CM | POA: Insufficient documentation

## 2012-09-12 DIAGNOSIS — R600 Localized edema: Secondary | ICD-10-CM

## 2012-09-13 ENCOUNTER — Encounter (HOSPITAL_BASED_OUTPATIENT_CLINIC_OR_DEPARTMENT_OTHER): Payer: Self-pay | Admitting: *Deleted

## 2012-09-13 ENCOUNTER — Emergency Department (HOSPITAL_BASED_OUTPATIENT_CLINIC_OR_DEPARTMENT_OTHER)
Admission: EM | Admit: 2012-09-13 | Discharge: 2012-09-13 | Disposition: A | Discharge status: Home / Self Care | Payer: 59 | Attending: Emergency Medicine | Admitting: Emergency Medicine

## 2012-09-13 ENCOUNTER — Emergency Department (HOSPITAL_BASED_OUTPATIENT_CLINIC_OR_DEPARTMENT_OTHER): Payer: 59

## 2012-09-13 DIAGNOSIS — R059 Cough, unspecified: Secondary | ICD-10-CM | POA: Insufficient documentation

## 2012-09-13 DIAGNOSIS — R0602 Shortness of breath: Secondary | ICD-10-CM

## 2012-09-13 DIAGNOSIS — L02419 Cutaneous abscess of limb, unspecified: Secondary | ICD-10-CM | POA: Insufficient documentation

## 2012-09-13 DIAGNOSIS — I10 Essential (primary) hypertension: Secondary | ICD-10-CM | POA: Insufficient documentation

## 2012-09-13 DIAGNOSIS — R05 Cough: Secondary | ICD-10-CM

## 2012-09-13 DIAGNOSIS — L03116 Cellulitis of left lower limb: Secondary | ICD-10-CM

## 2012-09-13 DIAGNOSIS — Z79899 Other long term (current) drug therapy: Secondary | ICD-10-CM | POA: Insufficient documentation

## 2012-09-13 LAB — CBC
HCT: 36.8 % (ref 36.0–46.0)
Hemoglobin: 11.9 g/dL — ABNORMAL LOW (ref 12.0–15.0)
MCH: 26.8 pg (ref 26.0–34.0)
MCHC: 32.3 g/dL (ref 30.0–36.0)

## 2012-09-13 LAB — BASIC METABOLIC PANEL
BUN: 9 mg/dL (ref 6–23)
Calcium: 9.1 mg/dL (ref 8.4–10.5)
GFR calc non Af Amer: 69 mL/min — ABNORMAL LOW (ref >90)
Glucose, Bld: 96 mg/dL (ref 70–99)

## 2012-09-13 MED ORDER — ACETAMINOPHEN 325 MG PO TABS
ORAL_TABLET | ORAL | Status: AC
  Administered 2012-09-13: 650 mg via ORAL
  Filled 2012-09-13: qty 2

## 2012-09-13 MED ORDER — ACETAMINOPHEN 325 MG PO TABS
650.0000 mg | ORAL_TABLET | Freq: Once | ORAL | Status: AC
  Administered 2012-09-13: 650 mg via ORAL

## 2012-09-13 MED ORDER — IOHEXOL 350 MG/ML SOLN
100.0000 mL | Freq: Once | INTRAVENOUS | Status: AC | PRN
  Administered 2012-09-13: 100 mL via INTRAVENOUS

## 2012-09-13 MED ORDER — ALBUTEROL SULFATE (5 MG/ML) 0.5% IN NEBU
5.0000 mg | INHALATION_SOLUTION | Freq: Once | RESPIRATORY_TRACT | Status: AC
  Administered 2012-09-13: 5 mg via RESPIRATORY_TRACT
  Filled 2012-09-13: qty 1

## 2012-09-13 MED ORDER — CLINDAMYCIN HCL 150 MG PO CAPS: 300.0000 mg | ORAL_CAPSULE | Freq: Three times a day (TID) | ORAL | Status: DC

## 2012-09-13 MED ORDER — IBUPROFEN 800 MG PO TABS
800.0000 mg | ORAL_TABLET | Freq: Once | ORAL | Status: AC
  Administered 2012-09-13: 800 mg via ORAL
  Filled 2012-09-13: qty 1

## 2012-09-13 MED ORDER — IPRATROPIUM BROMIDE 0.02 % IN SOLN
0.5000 mg | Freq: Once | RESPIRATORY_TRACT | Status: AC
  Administered 2012-09-13: 0.5 mg via RESPIRATORY_TRACT
  Filled 2012-09-13: qty 2.5

## 2012-09-13 MED ORDER — ALBUTEROL SULFATE HFA 108 (90 BASE) MCG/ACT IN AERS
2.0000 | INHALATION_SPRAY | RESPIRATORY_TRACT | Status: DC | PRN
  Filled 2012-09-13 (×2): qty 6.7

## 2012-09-13 NOTE — ED Notes (Addendum)
Patient c/o sob since last night, currently on antibiotic for L lower leg swelling, states she had some diarrhea/vomited yesterday, used inhaler around 7am this morning, no relief

## 2012-09-13 NOTE — ED Provider Notes (Signed)
History     CSN: [id number]  Arrival date & time 09/13/12  4098   First MD Initiated Contact with Patient 09/13/12 [phone]      Chief Complaint  Patient presents with  . Shortness of Breath    (Consider location/radiation/quality/duration/timing/severity/associated sxs/prior treatment) HPI Pt presenting with c/o shortness of breath and cough.  Pt has had cough for the past several days.  She saw her doctor who started her on an albuterol inhaler.  Also she has been started on augmentin for a lower lower extremity cellulitis.  She states the redness and swelling of her legs are improved since starting antibiotics.  She also had an ultrasound which did not show evidence of DVT on left side.  States cough became more pronoounced today, she tried her albuterol inhaler without much relief.  Cough is nonproductive, no chest pain.  There are no other associated systemic symptoms, there are no other alleviating or modifying factors.   Past Medical History  Diagnosis Date  . Hypertension   . Allergy     Past Surgical History  Procedure Laterality Date  . Dilation and curettage of uterus      Family History  Problem Relation Age of Onset  . Hypertension Brother   . Breast cancer Paternal Aunt     breast cancer  . Pancreatic cancer Maternal Grandmother     pancreatic  . Allergies Sister   . Allergies Brother     History  Substance Use Topics  . Smoking status: Never Smoker   . Smokeless tobacco: Not on file  . Alcohol Use: No    OB History   Grav Para Term Preterm Abortions TAB SAB Ect Mult Living   1         0      Review of Systems ROS reviewed and all otherwise negative except for mentioned in HPI  Allergies  Azithromycin  Home Medications   Current Outpatient Rx  Name  Route  Sig  Dispense  Refill  . Loratadine (CLARITIN PO)   Oral   Take by mouth.         Marland Kitchen amoxicillin-clavulanate (AUGMENTIN) 500-125 MG per tablet   Oral   Take 1 tablet (500 mg total) by  mouth 3 (three) times daily.   42 tablet   0   . clindamycin (CLEOCIN) 150 MG capsule   Oral   Take 2 capsules (300 mg total) by mouth 3 (three) times daily.   7 capsule   0   . hydrochlorothiazide (HYDRODIURIL) 12.5 MG tablet   Oral   Take 2 tablets (25 mg total) by mouth daily.   30 tablet   1   . olmesartan (BENICAR) 20 MG tablet   Oral   Take 1 tablet (20 mg total) by mouth daily.   30 tablet   1     BP 121/67  Pulse 87  Temp(Src) 98.8 F (37.1 C) (Oral)  Resp 20  SpO2 89%  LMP 09/05/2012 Vitals reviweed Physical Exam Physical Examination: General appearance - alert, well appearing, and in no distress Mental status - alert, oriented to person, place, and time Eyes - no conjunctival injection, no scleral icterus Mouth - mucous membranes moist, pharynx normal without lesions Chest - clear to auscultation, no wheezes, rales or rhonchi, symmetric air entry, no increased respiratory effort Heart - normal rate, regular rhythm, normal S1, S2, no murmurs, rubs, clicks or gallops Abdomen - soft, nontender, nondistended, no masses or organomegaly Extremities - peripheral pulses  normal, 1+ pedal edema on right and 2+ edema on left lower extremity, no clubbing or cyanosis Skin - normal coloration and turgor except left lower extremity with mild erythema and warmth  ED Course  Procedures (including critical care time)   Date: 09/13/2012  Rate: 100  Rhythm: normal sinus rhythm  QRS Axis: normal  Intervals: normal  ST/T Wave abnormalities: normal  Conduction Disutrbances: none  Narrative Interpretation: unremarkable, no significant change compared to prior ekg of 05/22/12     10:18 AM chart reviewed and patient had no evidence of DVT on lower extremity duplex u/s performed yesterday.   2:20 PM all results discussed with patient at the bedside and questions answered to the best of my abiltiy.   Labs Reviewed  CBC - Abnormal; Notable for the following:    Hemoglobin  11.9 (*)    All other components within normal limits  BASIC METABOLIC PANEL - Abnormal; Notable for the following:    Sodium 134 (*)    GFR calc non Af Amer 69 (*)    GFR calc Af Amer 80 (*)    All other components within normal limits  D-DIMER, QUANTITATIVE - Abnormal; Notable for the following:    D-Dimer, Quant 0.59 (*)    All other components within normal limits  CULTURE, BLOOD (ROUTINE X 2)  PRO B NATRIURETIC PEPTIDE  TROPONIN I   Dg Chest 2 View  09/13/2012  *RADIOLOGY REPORT*  Clinical Data: Shortness of breath.  CHEST - 2 VIEW  Comparison: 05/22/2012  Findings: Cardiomegaly with low lung volumes.  No focal airspace opacities, effusions or edema.  No acute bony abnormality.  IMPRESSION: Cardiomegaly, low lung volumes.   Original Report Authenticated By: Charlett Nose, M.D.    Ct Angio Chest Pe W/cm &/or Wo Cm  09/13/2012  *RADIOLOGY REPORT*  Clinical Data: At the D-dimer  CT ANGIOGRAPHY CHEST  Technique:  Multidetector CT imaging of the chest using the standard protocol during bolus administration of intravenous contrast. Multiplanar reconstructed images including MIPs were obtained and reviewed to evaluate the vascular anatomy.  Contrast: OMNIPAQUE IOHEXOL 350 MG/ML SOLN  Comparison: Chest radiographs dated 09/13/2012  Findings: No evidence of pulmonary embolism.  The lungs are essentially clear.  Mild linear scarring in the left lower lobe. No pleural effusion or pneumothorax.  Visualized thyroid is unremarkable.  The heart is normal in size.  No pericardial effusion.  No suspicious mediastinal, hilar, or axillary lymphadenopathy.  Visualized upper abdomen is unremarkable.  Degenerative changes of the visualized thoracolumbar spine.  IMPRESSION: No evidence of pulmonary embolism.  No evidence of acute cardiopulmonary disease.   Original Report Authenticated By: Charline Bills, M.D.    US Venous Img Lower Bilateral  09/12/2012  *RADIOLOGY REPORT*  Clinical Data: Bilateral leg  pain and edema  VENOUS DUPLEX ULTRASOUND OF BILATERAL LOWER EXTREMITIES  Technique:  Gray-scale sonography with graded compression, as well as color Doppler and duplex ultrasound, were performed to evaluate the deep venous system of both lower extremities from the level of the common femoral vein through the popliteal and proximal calf veins.  Spectral Doppler was utilized to evaluate flow at rest and with distal augmentation maneuvers.  Comparison:  None.  Findings: Ultrasound examination bilateral lower extremity demonstrate the visualized deep veins to be patent with good augmentation and compressibility noted.  There is no evidence of deep vein thrombosis.  Limited visualization of the calf veins due to the patient's large body habitus and superficial subcutaneous edema bilaterally.  IMPRESSION:  No evidence of deep vein thrombosis bilateral lower extremity. Suboptimal exam as described above.   Original Report Authenticated By: Natasha Mead, M.D.      1. Cough   2. Shortness of breath   3. Cellulitis of left lower extremity       MDM  Pt presenting with c/o cough and shortness of breath.  Also left lower extremity cellulitis. She states the cellulitis is improving after being started on abx.  CXR reassuring, EKG and troponin negative.  D-dimer was mildly elevated so ct angio chest obtained.  Pt states she felt improved after albuterol neb.  Suspect bronchitis.  She was encouraged to continue albuterol q4 hours as well as abx for cellulitis.  Discussed return precautions at length with patient.  Discharged with strict return precautions.  Pt agreeable with plan.        Ethelda Chick, MD 09/13/12 [phone]

## 2012-09-16 NOTE — Telephone Encounter (Signed)
lmomtcb x1 for pt 

## 2012-09-17 ENCOUNTER — Telehealth: Payer: Self-pay | Admitting: *Deleted

## 2012-09-17 NOTE — Telephone Encounter (Signed)
lmomtcb x2 for pt 

## 2012-09-17 NOTE — Telephone Encounter (Signed)
Returned pt's call. LVM.

## 2012-09-17 NOTE — Telephone Encounter (Signed)
Message copied by Perry Mount L on Wed Sep 17, 2012  2:26 PM ------      Message from: [place], Herbert Seta L      Created: Mon Sep 15, 2012 11:55 AM      Regarding: Medication Questions      Contact: [phone]       Pt was seen here on Thursday.  She was seen in emergency room over weekend and has questions about medications she was prescribed.  Please call her back at [phone]. ------

## 2012-09-18 ENCOUNTER — Other Ambulatory Visit (INDEPENDENT_AMBULATORY_CARE_PROVIDER_SITE_OTHER): Payer: Self-pay

## 2012-09-18 ENCOUNTER — Ambulatory Visit (INDEPENDENT_AMBULATORY_CARE_PROVIDER_SITE_OTHER): Payer: 59 | Admitting: General Surgery

## 2012-09-18 ENCOUNTER — Encounter (INDEPENDENT_AMBULATORY_CARE_PROVIDER_SITE_OTHER): Payer: Self-pay | Admitting: General Surgery

## 2012-09-18 ENCOUNTER — Encounter: Payer: Self-pay | Admitting: *Deleted

## 2012-09-18 ENCOUNTER — Ambulatory Visit: Payer: 59 | Admitting: Internal Medicine

## 2012-09-18 ENCOUNTER — Telehealth: Payer: Self-pay | Admitting: *Deleted

## 2012-09-18 NOTE — Addendum Note (Signed)
Addended by: Tommie Sams on: 09/18/2012 02:25 PM   Modules accepted: Orders

## 2012-09-18 NOTE — Progress Notes (Signed)
Subjective:   morbid obesity  Patient ID: Robin Cox, female   DOB: [birth year], 42 y.o.   MRN: [id number]  HPI Patient is a very pleasant 42 year old female referred by Dr. Lynnae Prude for consideration for surgical treatment for morbid obesity. The patient states she was relatively normal weight through high school but then experienced gradual weight gain throughout her 77s. In her 30s when she began to work regularly in her stress increased she noted her weight really began to spiral out of control. She has been through many efforts at nonsurgical weight loss with diet and exercise programs over the last 10 years and has had temporary success losing up to 60 pounds at a time but then experiences progressive weight regain. She initially was against surgery and felt she could do it on her own. However recently she has developed significant health problems including sleep apnea and shortness of breath with exertion as well as hypertension and is very concerned about her long-term health going forward at her current weight. She has been to our initial information seminar and is interested in sleeve gastrectomy as she does not like the idea of her intestines being rerouted if she does not want for material or the injections.  Past Medical History  Diagnosis Date  . Hypertension   . Allergy   . Sleep apnea    Past Surgical History  Procedure Laterality Date  . Dilation and curettage of uterus     Current Outpatient Prescriptions  Medication Sig Dispense Refill  . clindamycin (CLEOCIN) 150 MG capsule Take 2 capsules (300 mg total) by mouth 3 (three) times daily.  7 capsule  0  . hydrochlorothiazide (HYDRODIURIL) 12.5 MG tablet Take 2 tablets (25 mg total) by mouth daily.  30 tablet  1  . Loratadine (CLARITIN PO) Take by mouth.      . olmesartan (BENICAR) 20 MG tablet Take 1 tablet (20 mg total) by mouth daily.  30 tablet  1  . VENTOLIN HFA 108 (90 BASE) MCG/ACT inhaler        No current  facility-administered medications for this visit.   Allergies  Allergen Reactions  . Azithromycin      Review of Systems  Constitutional: Positive for fatigue.  HENT: Negative.   Respiratory: Positive for choking and shortness of breath.   Cardiovascular: Positive for leg swelling. Negative for chest pain and palpitations.  Gastrointestinal: Negative.        Reflux, mild, not requiring medication  Genitourinary: Negative.   Neurological: Negative.   Hematological: Negative.        Objective:   Physical Exam General: Alert, morbidly obese African American female, in no distress Skin: Warm and dry without rash or infection. HEENT: No palpable masses or thyromegaly. Sclera nonicteric. Pupils equal round and reactive. Oropharynx clear. Lymph nodes: No cervical, supraclavicular, or inguinal nodes palpable. Lungs: Breath sounds clear and equal without increased work of breathing Cardiovascular: Regular rate and rhythm without murmur. No JVD or edema. Peripheral pulses intact. Abdomen: Nondistended. Soft and nontender. No masses palpable. No organomegaly. No palpable hernias. Extremities: 1+ bilateral lower extremity edema. No chronic venous stasis changes. Neurologic: Alert and fully oriented. Gait normal.    Assessment:     Progressive morbid obesity unresponsive to medical management presenting at a BMI of 50 comorbidities of obstructive sleep apnea, hypertension and lower extremity edema. I think she would have significant medical benefit from surgical weight loss. We discussed the surgical options available including gastric bypass, lap band and  sleeve gastrectomy. She prefers sleeve gastrectomy after discussion for the reasons above I think this is a very reasonable choice for her. We discussed the procedure in detail including its nature in recovery and expected results. We discussed complications including anesthetic risks, bleeding, leakage and infection and pulmonary embolus as  well as long-term risks of stenosis, reflux and others. She was given a complete consent form to review.    Plan:     We'll proceed with preoperative workup for sleeve gastrectomy including upper GI series, psych and nutrition evaluations. She is being fitted for CPAP. I would support her having a preoperative echocardiogram to to her history of lower extremity edema and worsening shortness of breath. We will see her back following her evaluation.

## 2012-09-18 NOTE — Telephone Encounter (Signed)
lmomtcb x3 for pt. Will send pt a letter and will send message to RA so he is aware as well.

## 2012-09-18 NOTE — Telephone Encounter (Signed)
I spoke with patient about results and she verbalized understanding and had no questions. Order sent to [facility] 

## 2012-09-18 NOTE — Telephone Encounter (Signed)
Pt was seen in the ED and has questions about her abx that they gave her assured pt that it was ok to stop the augmentin and start the clindomycin

## 2012-09-19 LAB — CULTURE, BLOOD (ROUTINE X 2): Culture: NO GROWTH

## 2012-09-23 ENCOUNTER — Encounter: Payer: Self-pay | Admitting: Internal Medicine

## 2012-09-23 ENCOUNTER — Telehealth: Payer: Self-pay | Admitting: Internal Medicine

## 2012-09-23 ENCOUNTER — Ambulatory Visit (INDEPENDENT_AMBULATORY_CARE_PROVIDER_SITE_OTHER): Payer: 59 | Admitting: Internal Medicine

## 2012-09-23 VITALS — BP 128/77 | HR 98 | Temp 98.7°F | Resp 18 | Wt 263.0 lb

## 2012-09-23 DIAGNOSIS — R0609 Other forms of dyspnea: Secondary | ICD-10-CM

## 2012-09-23 DIAGNOSIS — I10 Essential (primary) hypertension: Secondary | ICD-10-CM

## 2012-09-23 DIAGNOSIS — L02419 Cutaneous abscess of limb, unspecified: Secondary | ICD-10-CM

## 2012-09-23 DIAGNOSIS — R0989 Other specified symptoms and signs involving the circulatory and respiratory systems: Secondary | ICD-10-CM

## 2012-09-23 DIAGNOSIS — L03119 Cellulitis of unspecified part of limb: Secondary | ICD-10-CM

## 2012-09-23 DIAGNOSIS — R609 Edema, unspecified: Secondary | ICD-10-CM

## 2012-09-23 DIAGNOSIS — R0600 Dyspnea, unspecified: Secondary | ICD-10-CM | POA: Insufficient documentation

## 2012-09-23 DIAGNOSIS — L03116 Cellulitis of left lower limb: Secondary | ICD-10-CM | POA: Insufficient documentation

## 2012-09-23 LAB — BASIC METABOLIC PANEL
BUN: 11 mg/dL (ref 6–23)
CO2: 32 mEq/L (ref 19–32)
Calcium: 9.4 mg/dL (ref 8.4–10.5)
Chloride: 95 mEq/L — ABNORMAL LOW (ref 96–112)
Creat: 0.89 mg/dL (ref 0.50–1.10)
Glucose, Bld: 93 mg/dL (ref 70–99)
Potassium: 3.8 mEq/L (ref 3.5–5.3)
Sodium: 137 mEq/L (ref 135–145)

## 2012-09-23 MED ORDER — POTASSIUM CHLORIDE ER 10 MEQ PO TBCR: 10.0000 meq | EXTENDED_RELEASE_TABLET | Freq: Two times a day (BID) | ORAL | Status: DC

## 2012-09-23 MED ORDER — HYDROCHLOROTHIAZIDE 12.5 MG PO TABS: ORAL_TABLET | ORAL | Status: DC

## 2012-09-23 NOTE — Telephone Encounter (Signed)
Pt scheduled with Dr Jens Som Friday Oct 24, 2012 at 4:00pm.  Pt made aware of appt, date, time, location, & phone number by in office. KH

## 2012-09-23 NOTE — Progress Notes (Signed)
Subjective:    Patient ID: Robin Cox, female    DOB: [birth year], 42 y.o.   MRN: [id number]  HPI Naveya is here for follow up.  She has lost 11 lbs since last visit.  She was seen in ER on 4/19 for SOB. CArdiac work up negative and CT Angio neg for PE.    Augmentin was making pt nauseated so she was changed to clindamycin which she finished.  SOB has resolved and she has not used her albuterol inhaler in the past 3 days.  She has never had chest pain or pressure  Cellulitis of Left leg markedly improved.   She is now off antibiotics.    Morbid obesity.  She had initial evaluation with Dr. Johna Sheriff.  He would like cardiology approval prior to surgery.  She has opted for gastric sleeve sometime this summer   She does have cardiomegaly on CXR.    Allergies  Allergen Reactions  . Augmentin (Amoxicillin-Pot Clavulanate) Nausea And Vomiting  . Azithromycin    Past Medical History  Diagnosis Date  . Hypertension   . Allergy   . Sleep apnea    Past Surgical History  Procedure Laterality Date  . Dilation and curettage of uterus     History   Social History  . Marital Status: Single    Spouse Name: N/A    Number of Children: N/A  . Years of Education: N/A   Occupational History  . Not on file.   Social History Main Topics  . Smoking status: Never Smoker   . Smokeless tobacco: Never Used  . Alcohol Use: No  . Drug Use: No  . Sexually Active: No   Other Topics Concern  . Not on file   Social History Narrative  . No narrative on file   Family History  Problem Relation Age of Onset  . Hypertension Brother   . Allergies Brother   . Pancreatic cancer Maternal Grandmother     pancreatic  . Allergies Sister   . Hypertension Sister   . Breast cancer Maternal Aunt   . Allergies Sister    Patient Active Problem List   Diagnosis Date Noted  . Dyspnea 09/23/2012  . Cellulitis of leg, left 09/23/2012  . Essential hypertension, benign 06/16/2012  . Arrhythmia, sinus node  06/16/2012  . Morbid obesity 06/16/2012  . OSA (obstructive sleep apnea) 06/16/2012   Current Outpatient Prescriptions on File Prior to Visit  Medication Sig Dispense Refill  . Loratadine (CLARITIN PO) Take by mouth.      . olmesartan (BENICAR) 20 MG tablet Take 1 tablet (20 mg total) by mouth daily.  30 tablet  1  . VENTOLIN HFA 108 (90 BASE) MCG/ACT inhaler        No current facility-administered medications on file prior to visit.       Review of Systems See HPI    Objective:   Physical Exam Physical Exam  Nursing note and vitals reviewed.  Constitutional: She is oriented to person, place, and time. She appears well-developed and well-nourished.  HENT:  Head: Normocephalic and atraumatic.  Cardiovascular: Normal rate and regular rhythm. Exam reveals no gallop and no friction rub.  No murmur heard.  Pulmonary/Chest: Breath sounds normal. She has no wheezes. She has no rales.  Neurological: She is alert and oriented to person, place, and time.  Skin: Skin is warm and dry.  Psychiatric: She has a normal mood and affect. Her behavior is normal.  Ext:  L leg redness  improving.  All edema of bilateral extremities has resolved        Assessment & Plan:  LLE cellulitis  Off antibiotics marked improvement  SOB.  Markedly improved with 11 lb weight loss  Will refer to cardiology for ECHO and possible stress eval prior to undergoing a gastric sleeve.    Can use albuterol on prn basis  Bilateral LE edema  Will continue HCTZ low dose with K-dur 10 meq for now.    Check K today  HTN  Well controlled  On Benicar and HCTZ    Severe  OSA  Pending   CPAP  Titration with Dr. Vassie Loll

## 2012-09-23 NOTE — Patient Instructions (Addendum)
See me in 3 months

## 2012-09-25 ENCOUNTER — Encounter: Payer: Self-pay | Admitting: *Deleted

## 2012-10-03 ENCOUNTER — Telehealth: Payer: Self-pay | Admitting: *Deleted

## 2012-10-03 ENCOUNTER — Other Ambulatory Visit (INDEPENDENT_AMBULATORY_CARE_PROVIDER_SITE_OTHER): Payer: Self-pay | Admitting: General Surgery

## 2012-10-03 LAB — CBC WITH DIFFERENTIAL/PLATELET
Basos: 0 % (ref 0–3)
Eos: 2 % (ref 0–5)
Lymphs: 39 % (ref 14–46)
Monocytes: 6 % (ref 4–12)
Neutrophils Relative %: 53 % (ref 40–74)
RBC: 3.96 x10E6/uL (ref 3.77–5.28)
RDW: 15.2 % (ref 12.3–15.4)
WBC: 7.8 10*3/uL (ref 3.4–10.8)

## 2012-10-03 LAB — COMPREHENSIVE METABOLIC PANEL
Albumin/Globulin Ratio: 1.2 (ref 1.1–2.5)
Albumin: 3.8 g/dL (ref 3.5–5.5)
Alkaline Phosphatase: 61 IU/L (ref 39–117)
BUN/Creatinine Ratio: 11 (ref 9–23)
BUN: 11 mg/dL (ref 6–24)
Chloride: 98 mmol/L (ref 96–108)
GFR calc Af Amer: 84 mL/min/{1.73_m2} (ref >59)
GFR calc non Af Amer: 73 mL/min/{1.73_m2} (ref >59)
Total Bilirubin: 0.1 mg/dL (ref 0.0–1.2)

## 2012-10-03 LAB — HCG, QUANTITATIVE, PREGNANCY: hCG Quant: <2 m[IU]/mL

## 2012-10-03 NOTE — Telephone Encounter (Signed)
These need to be refilled with pt new pharmacy [facility]. Pt and pharmacy requesting a 90 day supply

## 2012-10-04 LAB — LIPID PANEL
Chol/HDL Ratio: 3 ratio units (ref 0.0–4.4)
Cholesterol, Total: 122 mg/dL (ref 100–199)
LDL Calculated: 71 mg/dL (ref 0–99)
Triglycerides: 52 mg/dL (ref 0–149)
VLDL Cholesterol Cal: 10 mg/dL (ref 5–40)

## 2012-10-05 MED ORDER — POTASSIUM CHLORIDE ER 10 MEQ PO TBCR: EXTENDED_RELEASE_TABLET | ORAL | Status: DC

## 2012-10-05 MED ORDER — OLMESARTAN MEDOXOMIL 20 MG PO TABS: 20.0000 mg | ORAL_TABLET | Freq: Every day | ORAL | Status: DC

## 2012-10-05 MED ORDER — HYDROCHLOROTHIAZIDE 25 MG PO TABS: 25.0000 mg | ORAL_TABLET | Freq: Every day | ORAL | Status: DC

## 2012-10-05 NOTE — Telephone Encounter (Signed)
Robin Cox  Call pt and let her her know that her potassium pill can be taken one time daily  (not bid)  Keep her follow up appt with me

## 2012-10-08 MED ORDER — OLMESARTAN MEDOXOMIL 20 MG PO TABS: 20.0000 mg | ORAL_TABLET | Freq: Every day | ORAL | Status: DC

## 2012-10-08 MED ORDER — HYDROCHLOROTHIAZIDE 25 MG PO TABS: 25.0000 mg | ORAL_TABLET | Freq: Every day | ORAL | Status: DC

## 2012-10-08 NOTE — Telephone Encounter (Signed)
Pt requested 10 days worth of medication until mail order can get her meds to her. reordered 14 days worth sent to local pharmacy

## 2012-10-09 ENCOUNTER — Other Ambulatory Visit (INDEPENDENT_AMBULATORY_CARE_PROVIDER_SITE_OTHER): Payer: Self-pay

## 2012-10-09 ENCOUNTER — Ambulatory Visit (HOSPITAL_COMMUNITY)
Admission: RE | Admit: 2012-10-09 | Discharge: 2012-10-09 | Disposition: A | Discharge status: Home / Self Care | Payer: 59 | Source: Ambulatory Visit | Attending: General Surgery | Admitting: General Surgery

## 2012-10-09 ENCOUNTER — Encounter: Payer: 59 | Attending: General Surgery | Admitting: *Deleted

## 2012-10-09 ENCOUNTER — Encounter: Payer: Self-pay | Admitting: *Deleted

## 2012-10-09 DIAGNOSIS — Z713 Dietary counseling and surveillance: Secondary | ICD-10-CM | POA: Insufficient documentation

## 2012-10-09 DIAGNOSIS — G4730 Sleep apnea, unspecified: Secondary | ICD-10-CM | POA: Insufficient documentation

## 2012-10-09 DIAGNOSIS — Z6841 Body Mass Index (BMI) 40.0 and over, adult: Secondary | ICD-10-CM | POA: Insufficient documentation

## 2012-10-09 DIAGNOSIS — E669 Obesity, unspecified: Secondary | ICD-10-CM | POA: Insufficient documentation

## 2012-10-09 DIAGNOSIS — I10 Essential (primary) hypertension: Secondary | ICD-10-CM | POA: Insufficient documentation

## 2012-10-09 NOTE — Patient Instructions (Signed)
   Follow Pre-Op Nutrition Goals to prepare for Gastric Sleeve Surgery.   Call the [facility] at [phone] once you have been given your surgery date to enrolled in the Pre-Op Nutrition Class. You will need to attend this nutrition class 3-4 weeks prior to your surgery.  

## 2012-10-09 NOTE — Progress Notes (Addendum)
  Pre-Op Assessment Visit:  Pre-Operative Sleeve Gastrectomy Surgery  Medical Nutrition Therapy:  Appt start time: 1145   End time:  1255.  Patient was seen on 10/09/2012 for Pre-Operative Sleeve Gastrectomy Nutrition Assessment. Assessment and letter of approval faxed to [facility] Bariatric Surgery Program coordinator on 10/09/2012.  Approval letter sent to [facility] and will be available in the chart under the media tab.  Handouts given during visit include:  Pre-Op Goals   Bariatric Surgery Protein Shakes  Bariatric Surgery Support Group Dates  Samples given during visit include:   Premier Protein Shake: 1 bottle Lot # [id number] Exp: 06/09/13  Patient to call for Pre-Op and Post-Op Nutrition Education at the [facility] when surgery is scheduled.

## 2012-10-24 ENCOUNTER — Institutional Professional Consult (permissible substitution): Payer: 59 | Admitting: Cardiology

## 2012-10-24 ENCOUNTER — Encounter: Payer: Self-pay | Admitting: Cardiology

## 2012-10-24 ENCOUNTER — Encounter: Payer: Self-pay | Admitting: *Deleted

## 2012-10-27 ENCOUNTER — Ambulatory Visit (INDEPENDENT_AMBULATORY_CARE_PROVIDER_SITE_OTHER): Payer: 59 | Admitting: Cardiology

## 2012-10-27 ENCOUNTER — Encounter: Payer: Self-pay | Admitting: Cardiology

## 2012-10-27 VITALS — BP 138/82 | HR 87 | Wt 269.0 lb

## 2012-10-27 DIAGNOSIS — R0600 Dyspnea, unspecified: Secondary | ICD-10-CM

## 2012-10-27 DIAGNOSIS — I10 Essential (primary) hypertension: Secondary | ICD-10-CM

## 2012-10-27 DIAGNOSIS — Z01810 Encounter for preprocedural cardiovascular examination: Secondary | ICD-10-CM | POA: Insufficient documentation

## 2012-10-27 NOTE — Assessment & Plan Note (Signed)
Blood pressure is controlled. Continue present medications. 

## 2012-10-27 NOTE — Patient Instructions (Addendum)
Your physician recommends that you schedule a follow-up appointment in: AS NEEDED PENDING TEST RESULTS  Your physician has requested that you have a stress echocardiogram. For further information please visit [email or web address]. Please follow instruction sheet as given.    

## 2012-10-27 NOTE — Assessment & Plan Note (Signed)
Plan stress echocardiogram preoperatively for risk stratification.

## 2012-10-27 NOTE — Progress Notes (Signed)
  HPI: 42 year old female for evaluation of dyspnea. CTA in April of 2014 showed no pulmonary embolus. Lower extremity Dopplers in April 2014 showed no DVT. Laboratories in April of 2014 showed a pro BNP of 10. Troponin negative. Laboratories in May of 2014 showed normal renal function. Liver functions normal. TSH normal. Hemoglobin 10.2. Patient describes increased dyspnea on exertion for approximately 10 months. This occurs with walking up stairs or extended distances. She also has orthopnea, PND and pedal edema. No chest pain or syncope. She is scheduled for gastric sleeve surgery and cardiology asked to evaluate preoperatively.  Current Outpatient Prescriptions  Medication Sig Dispense Refill  . hydrochlorothiazide (HYDRODIURIL) 25 MG tablet Take 1 tablet (25 mg total) by mouth daily. Take one tablet daily  14 tablet  0  . Loratadine (CLARITIN PO) Take 10 mg by mouth daily.       Marland Kitchen olmesartan (BENICAR) 20 MG tablet Take 1 tablet (20 mg total) by mouth daily.  14 tablet  0  . potassium chloride (K-DUR) 10 MEQ tablet Take one tablet daily  90 tablet  0  . VENTOLIN HFA 108 (90 BASE) MCG/ACT inhaler Inhale 2 puffs into the lungs as needed.        No current facility-administered medications for this visit.    Allergies  Allergen Reactions  . Augmentin (Amoxicillin-Pot Clavulanate) Nausea And Vomiting  . Azithromycin     Past Medical History  Diagnosis Date  . Hypertension   . Allergy   . Sleep apnea   . Morbid obesity     Past Surgical History  Procedure Laterality Date  . Dilation and curettage of uterus  1990    History   Social History  . Marital Status: Single    Spouse Name: N/A    Number of Children: N/A  . Years of Education: N/A   Occupational History  . Not on file.   Social History Main Topics  . Smoking status: Never Smoker   . Smokeless tobacco: Never Used  . Alcohol Use: Yes     Comment: Rare  . Drug Use: No  . Sexually Active: No   Other Topics Concern   . Not on file   Social History Narrative  . No narrative on file    Family History  Problem Relation Age of Onset  . Hypertension Brother   . Allergies Brother   . Pancreatic cancer Maternal Grandmother     pancreatic  . Allergies Sister   . Hypertension Sister   . Breast cancer Maternal Aunt   . Allergies Sister   . Stroke Brother     ROS: no fevers or chills, productive cough, hemoptysis, dysphasia, odynophagia, melena, hematochezia, dysuria, hematuria, rash, seizure activity, claudication. Remaining systems are negative.  Physical Exam:   Blood pressure 138/82, pulse 87, weight 269 lb (122.018 kg), last menstrual period 10/09/2012.  General:  Well developed/obese in NAD Skin warm/dry Patient not depressed No peripheral clubbing Back-normal HEENT-normal/normal eyelids Neck supple/normal carotid upstroke bilaterally; no bruits; no JVD; no thyromegaly chest - CTA/ normal expansion CV - RRR/normal S1 and S2; no murmurs, rubs or gallops;  PMI nondisplaced Abdomen -NT/ND, no HSM, no mass, + bowel sounds, no bruit 2+ femoral pulses, no bruits Ext-1+ edema, no chords, 2+ DP Neuro-grossly nonfocal  ECG 09/13/2012-sinus rhythm, right axis deviation, nonspecific T-wave changes.

## 2012-10-27 NOTE — Assessment & Plan Note (Signed)
Etiology unclear. Possibly related to weight, OSA, OHS. Given her upcoming surgery we will plan to proceed with risk stratification. Plan stress echocardiogram with contrast to evaluate LV function and to screen for coronary disease.

## 2012-11-07 ENCOUNTER — Other Ambulatory Visit (HOSPITAL_COMMUNITY): Payer: Self-pay | Admitting: Cardiology

## 2012-11-07 ENCOUNTER — Ambulatory Visit (HOSPITAL_COMMUNITY): Payer: 59

## 2012-11-07 ENCOUNTER — Encounter: Payer: Self-pay | Admitting: Cardiology

## 2012-11-07 ENCOUNTER — Ambulatory Visit (HOSPITAL_COMMUNITY): Payer: 59 | Attending: Cardiology | Admitting: Radiology

## 2012-11-07 DIAGNOSIS — I10 Essential (primary) hypertension: Secondary | ICD-10-CM

## 2012-11-07 DIAGNOSIS — R404 Transient alteration of awareness: Secondary | ICD-10-CM

## 2012-11-07 DIAGNOSIS — R0600 Dyspnea, unspecified: Secondary | ICD-10-CM

## 2012-11-07 DIAGNOSIS — Z01810 Encounter for preprocedural cardiovascular examination: Secondary | ICD-10-CM

## 2012-11-07 DIAGNOSIS — R0989 Other specified symptoms and signs involving the circulatory and respiratory systems: Secondary | ICD-10-CM | POA: Insufficient documentation

## 2012-11-07 DIAGNOSIS — R0609 Other forms of dyspnea: Secondary | ICD-10-CM | POA: Insufficient documentation

## 2012-11-07 DIAGNOSIS — I495 Sick sinus syndrome: Secondary | ICD-10-CM

## 2012-11-07 MED ORDER — PERFLUTREN PROTEIN A MICROSPH IV SUSP
3.5000 mL | Freq: Once | INTRAVENOUS | Status: AC
  Administered 2012-11-07: 3.5 mL via INTRAVENOUS

## 2012-11-07 MED ORDER — PERFLUTREN PROTEIN A MICROSPH IV SUSP: 0.5000 mL | Freq: Once | INTRAVENOUS | Status: DC

## 2012-11-07 NOTE — Progress Notes (Signed)
Stress Echocardiogram performed with optison.  

## 2012-11-11 ENCOUNTER — Ambulatory Visit: Payer: 59 | Admitting: Internal Medicine

## 2012-11-18 ENCOUNTER — Telehealth: Payer: Self-pay | Admitting: Cardiology

## 2012-11-18 NOTE — Telephone Encounter (Signed)
Spoke with pt, aware of stress test results. Forwarded to dr Johna Sheriff

## 2012-11-18 NOTE — Telephone Encounter (Signed)
New Problem  Pt is returning your call from yesterday.

## 2012-12-08 ENCOUNTER — Ambulatory Visit (INDEPENDENT_AMBULATORY_CARE_PROVIDER_SITE_OTHER): Payer: 59 | Admitting: Internal Medicine

## 2012-12-08 ENCOUNTER — Encounter: Payer: Self-pay | Admitting: Internal Medicine

## 2012-12-08 DIAGNOSIS — D649 Anemia, unspecified: Secondary | ICD-10-CM

## 2012-12-08 DIAGNOSIS — I10 Essential (primary) hypertension: Secondary | ICD-10-CM

## 2012-12-08 NOTE — Patient Instructions (Addendum)
Fax labs to me

## 2012-12-08 NOTE — Progress Notes (Signed)
Subjective:    Patient ID: Robin Cox, female    DOB: Feb 29, 1971, 42 y.o.   MRN: [id number]  HPI  Robin Cox is here for follow up.  She has her gastric sleeve surgery scheduled for early August  She did have pre-op cardiac eval with Dr. Jens Som.    She reports no chest pain,  No SOB  See labs  She is anemic.  She reports she does have heavy menses the first 2 days of her cycle.  Changes pad every 3 hours during the first few days.  There is no history of sickle cell or sickle trait  Allergies  Allergen Reactions  . Augmentin (Amoxicillin-Pot Clavulanate) Nausea And Vomiting  . Azithromycin    Past Medical History  Diagnosis Date  . Hypertension   . Allergy   . Sleep apnea   . Morbid obesity    Past Surgical History  Procedure Laterality Date  . Dilation and curettage of uterus  1990   History   Social History  . Marital Status: Single    Spouse Name: N/A    Number of Children: N/A  . Years of Education: N/A   Occupational History  . Not on file.   Social History Main Topics  . Smoking status: Never Smoker   . Smokeless tobacco: Never Used  . Alcohol Use: Yes     Comment: Rare  . Drug Use: No  . Sexually Active: No   Other Topics Concern  . Not on file   Social History Narrative  . No narrative on file   Family History  Problem Relation Age of Onset  . Hypertension Brother   . Allergies Brother   . Pancreatic cancer Maternal Grandmother     pancreatic  . Allergies Sister   . Hypertension Sister   . Breast cancer Maternal Aunt   . Allergies Sister   . Stroke Brother    Patient Active Problem List   Diagnosis Date Noted  . Anemia 12/08/2012  . Preop cardiovascular exam 10/27/2012  . Dyspnea 09/23/2012  . Cellulitis of leg, left 09/23/2012  . Essential hypertension, benign 06/16/2012  . Arrhythmia, sinus node 06/16/2012  . Morbid obesity 06/16/2012  . OSA (obstructive sleep apnea) 06/16/2012   Current Outpatient Prescriptions on File Prior  to Visit  Medication Sig Dispense Refill  . hydrochlorothiazide (HYDRODIURIL) 25 MG tablet Take 1 tablet (25 mg total) by mouth daily. Take one tablet daily  14 tablet  0  . Loratadine (CLARITIN PO) Take 10 mg by mouth daily.       Marland Kitchen olmesartan (BENICAR) 20 MG tablet Take 1 tablet (20 mg total) by mouth daily.  14 tablet  0  . potassium chloride (K-DUR) 10 MEQ tablet Take one tablet daily  90 tablet  0  . VENTOLIN HFA 108 (90 BASE) MCG/ACT inhaler Inhale 2 puffs into the lungs as needed.        No current facility-administered medications on file prior to visit.      Review of Systems    see HPI Objective:   Physical Exam Physical Exam  Nursing note and vitals reviewed.  Constitutional: She is oriented to person, place, and time. She appears well-developed and well-nourished.  HENT:  Head: Normocephalic and atraumatic.  Cardiovascular: Normal rate and regular rhythm. Exam reveals no gallop and no friction rub.  No murmur heard.  Pulmonary/Chest: Breath sounds normal. She has no wheezes. She has no rales.  Neurological: She is alert and oriented to person, place,  and time.  Skin: Skin is warm and dry.  Psychiatric: She has a normal mood and affect. Her behavior is normal.        Assessment & Plan:  Morbid obesity upcoming gastric sleeve  HTN  conitnue meds for now  Anemia  Will check b12 folate.  Will start Integra with vit C one daily.  Recheck today along with BMP.  [name] works for [employer] and she will get labs there.  She is counseled to fax results to me  [facility]

## 2012-12-18 ENCOUNTER — Encounter: Payer: 59 | Attending: General Surgery

## 2012-12-18 ENCOUNTER — Ambulatory Visit (INDEPENDENT_AMBULATORY_CARE_PROVIDER_SITE_OTHER): Payer: 59 | Admitting: General Surgery

## 2012-12-18 DIAGNOSIS — Z713 Dietary counseling and surveillance: Secondary | ICD-10-CM | POA: Insufficient documentation

## 2012-12-18 DIAGNOSIS — E669 Obesity, unspecified: Secondary | ICD-10-CM | POA: Insufficient documentation

## 2012-12-18 NOTE — Progress Notes (Signed)
Need orders please - pt coming for pre op THURS 12/25/12 - thank you

## 2012-12-19 ENCOUNTER — Encounter (HOSPITAL_COMMUNITY): Payer: Self-pay | Admitting: Pharmacy Technician

## 2012-12-19 ENCOUNTER — Ambulatory Visit (INDEPENDENT_AMBULATORY_CARE_PROVIDER_SITE_OTHER): Payer: 59 | Admitting: General Surgery

## 2012-12-22 ENCOUNTER — Other Ambulatory Visit (INDEPENDENT_AMBULATORY_CARE_PROVIDER_SITE_OTHER): Payer: Self-pay | Admitting: General Surgery

## 2012-12-25 ENCOUNTER — Encounter (INDEPENDENT_AMBULATORY_CARE_PROVIDER_SITE_OTHER): Payer: Self-pay | Admitting: General Surgery

## 2012-12-25 ENCOUNTER — Encounter (HOSPITAL_COMMUNITY): Payer: Self-pay

## 2012-12-25 ENCOUNTER — Encounter (HOSPITAL_COMMUNITY)
Admission: RE | Admit: 2012-12-25 | Discharge: 2012-12-25 | Disposition: A | Discharge status: Home / Self Care | Payer: 59 | Source: Ambulatory Visit | Attending: General Surgery | Admitting: General Surgery

## 2012-12-25 ENCOUNTER — Ambulatory Visit (INDEPENDENT_AMBULATORY_CARE_PROVIDER_SITE_OTHER): Payer: 59 | Admitting: General Surgery

## 2012-12-25 DIAGNOSIS — Z01812 Encounter for preprocedural laboratory examination: Secondary | ICD-10-CM | POA: Insufficient documentation

## 2012-12-25 HISTORY — DX: Anemia, unspecified: D64.9

## 2012-12-25 HISTORY — DX: Headache: R51

## 2012-12-25 LAB — COMPREHENSIVE METABOLIC PANEL
ALT: 14 U/L (ref 0–35)
Alkaline Phosphatase: 48 U/L (ref 39–117)
BUN: 15 mg/dL (ref 6–23)
CO2: 32 mEq/L (ref 19–32)
GFR calc Af Amer: 77 mL/min — ABNORMAL LOW (ref >90)
GFR calc non Af Amer: 67 mL/min — ABNORMAL LOW (ref >90)
Glucose, Bld: 93 mg/dL (ref 70–99)
Potassium: 4 mEq/L (ref 3.5–5.1)
Sodium: 132 mEq/L — ABNORMAL LOW (ref 135–145)
Total Bilirubin: 0.2 mg/dL — ABNORMAL LOW (ref 0.3–1.2)

## 2012-12-25 LAB — CBC WITH DIFFERENTIAL/PLATELET
Hemoglobin: 11.2 g/dL — ABNORMAL LOW (ref 12.0–15.0)
Lymphocytes Relative: 39 % (ref 12–46)
Lymphs Abs: 3.3 10*3/uL (ref 0.7–4.0)
MCV: 83.1 fL (ref 78.0–100.0)
Monocytes Relative: 6 % (ref 3–12)
Neutrophils Relative %: 54 % (ref 43–77)
Platelets: 312 10*3/uL (ref 150–400)
RBC: 4.21 MIL/uL (ref 3.87–5.11)
WBC: 8.6 10*3/uL (ref 4.0–10.5)

## 2012-12-25 NOTE — Patient Instructions (Signed)
YOUR SURGERY IS SCHEDULED AT [facility]  ON:   Tuesday  8/5  REPORT TO [facility] AT:  5:15 AM      PHONE # FOR SHORT STAY IS [phone]  DO NOT EAT OR DRINK ANYTHING AFTER MIDNIGHT THE NIGHT BEFORE YOUR SURGERY.  YOU MAY BRUSH YOUR TEETH, RINSE OUT YOUR MOUTH--BUT NO WATER, NO FOOD, NO CHEWING GUM, NO MINTS, NO CANDIES, NO CHEWING TOBACCO.  PLEASE TAKE THE FOLLOWING MEDICATIONS THE AM OF YOUR SURGERY WITH A FEW SIPS OF WATER:  USE YOUR ALBUTEROL INHALER   IF YOU USE INHALERS--USE YOUR INHALERS THE AM OF YOUR SURGERY AND BRING INHALERS TO THE HOSPITAL.     IF YOU HAVE SLEEP APNEA AND USE CPAP OR BIPAP--PLEASE BRING THE MASK AND THE TUBING.  DO NOT BRING YOUR MACHINE.  DO NOT BRING VALUABLES, MONEY, CREDIT CARDS.  DO NOT WEAR JEWELRY, MAKE-UP, NAIL POLISH AND NO METAL PINS OR CLIPS IN YOUR HAIR. CONTACT LENS, DENTURES / PARTIALS, GLASSES SHOULD NOT BE WORN TO SURGERY AND IN MOST CASES-HEARING AIDS WILL NEED TO BE REMOVED.  BRING YOUR GLASSES CASE, ANY EQUIPMENT NEEDED FOR YOUR CONTACT LENS. FOR PATIENTS ADMITTED TO THE HOSPITAL--CHECK OUT TIME THE DAY OF DISCHARGE IS 11:00 AM.  ALL INPATIENT ROOMS ARE PRIVATE - WITH BATHROOM, TELEPHONE, TELEVISION AND WIFI INTERNET.   FAILURE TO FOLLOW THESE INSTRUCTIONS MAY RESULT IN THE CANCELLATION OF YOUR SURGERY.   PATIENT SIGNATURE_________________________________

## 2012-12-25 NOTE — Pre-Procedure Instructions (Signed)
PT HAS EKG REPORT IN EPIC DONE 09/13/12 AND CXR REPORT IN EPIC FROM 10/09/12.

## 2012-12-25 NOTE — Progress Notes (Signed)
Chief complaint: Preop sleeve gastrectomy  History: Patient returns for her preop visit prior to planned laparoscopic sleeve gastrectomy for morbid obesity next week. She has completed her preoperative workup. We reviewed her psych and nutrition evaluations which were unremarkable. Upper GI series was normal. Lab work was unremarkable. We completely reviewed the consent form and all her questions were answered.  Past Medical History  Diagnosis Date  . Hypertension   . Allergy   . Sleep apnea   . Morbid obesity   . Shortness of breath oct 2013    pt given inhaler for use as needed for wheezing and sob-that has helped - no diagnosis of asthma or respiratory problems  . Headache(784.0)     h/a's prior to starting b/p medication - no problems since  . Anemia     pt on iron pills   Past Surgical History  Procedure Laterality Date  . Dilation and curettage of uterus  1990   Current Outpatient Prescriptions  Medication Sig Dispense Refill  . albuterol (PROVENTIL HFA;VENTOLIN HFA) 108 (90 BASE) MCG/ACT inhaler Inhale 2 puffs into the lungs every 6 (six) hours as needed for wheezing.      . Fe Fum-FePoly-Vit C-Vit B3 (INTEGRA) 62.5-62.5-40-3 MG CAPS Take by mouth. Every am      . hydrochlorothiazide (HYDRODIURIL) 25 MG tablet Take 25 mg by mouth every evening.      . loratadine (CLARITIN) 10 MG tablet Take 10 mg by mouth every evening.      . olmesartan (BENICAR) 20 MG tablet Take 20 mg by mouth every evening.      . potassium chloride (K-DUR,KLOR-CON) 10 MEQ tablet Take 10 mEq by mouth every evening.       No current facility-administered medications for this visit.   Allergies  Allergen Reactions  . Augmentin (Amoxicillin-Pot Clavulanate) Nausea And Vomiting  . Azithromycin Nausea And Vomiting    Got extremely hot    Exam: BP 124/68  Pulse 88  Resp 16  Ht 5' 1.5" (1.562 m)  Wt 262 lb 9.6 oz (119.115 kg)  BMI 48.82 kg/m2  LMP 12/13/2012 General: Morbidly obese otherwise  well-appearing [place] female Skin: Right ear infection HEENT: Sclerae not icteric. No masses Lungs: Clear equal breath sounds bilaterally Cardiac: Regular rate and rhythm. No edema. Abdomen: Soft and nontender. No discernible masses or organomegaly Extremities: No swelling or edema Neurologic: Alert and fully oriented. Gait normal.  Assessment and plan: Morbid obesity with comorbidities of obstructive sleep apnea and hypertension and history of lower trinity edema. She is on her preoperative diet. Procedure reviewed and all questions answered as above.

## 2012-12-30 ENCOUNTER — Encounter (HOSPITAL_COMMUNITY): Payer: Self-pay | Admitting: Certified Registered Nurse Anesthetist

## 2012-12-30 ENCOUNTER — Inpatient Hospital Stay (HOSPITAL_COMMUNITY): Payer: 59 | Admitting: Certified Registered Nurse Anesthetist

## 2012-12-30 ENCOUNTER — Encounter (HOSPITAL_COMMUNITY): Payer: Self-pay | Admitting: *Deleted

## 2012-12-30 ENCOUNTER — Encounter (HOSPITAL_COMMUNITY)
Admission: RE | Disposition: A | Discharge status: Home / Self Care | Payer: Self-pay | Source: Ambulatory Visit | Attending: General Surgery

## 2012-12-30 ENCOUNTER — Inpatient Hospital Stay (HOSPITAL_COMMUNITY)
Admission: RE | Admit: 2012-12-30 | Discharge: 2013-01-01 | DRG: 621 | Disposition: A | Discharge status: Home / Self Care | Payer: 59 | Source: Ambulatory Visit | Attending: General Surgery | Admitting: General Surgery

## 2012-12-30 DIAGNOSIS — Z6841 Body Mass Index (BMI) 40.0 and over, adult: Secondary | ICD-10-CM

## 2012-12-30 DIAGNOSIS — I10 Essential (primary) hypertension: Secondary | ICD-10-CM | POA: Diagnosis present

## 2012-12-30 DIAGNOSIS — Z01812 Encounter for preprocedural laboratory examination: Secondary | ICD-10-CM

## 2012-12-30 DIAGNOSIS — G4733 Obstructive sleep apnea (adult) (pediatric): Secondary | ICD-10-CM

## 2012-12-30 DIAGNOSIS — Z79899 Other long term (current) drug therapy: Secondary | ICD-10-CM

## 2012-12-30 HISTORY — PX: ESOPHAGOGASTRODUODENOSCOPY: SHX5428

## 2012-12-30 HISTORY — PX: LAPAROSCOPIC GASTRIC SLEEVE RESECTION: SHX5895

## 2012-12-30 LAB — HEMOGLOBIN AND HEMATOCRIT, BLOOD
HCT: 32.9 % — ABNORMAL LOW (ref 36.0–46.0)
Hemoglobin: 10.4 g/dL — ABNORMAL LOW (ref 12.0–15.0)

## 2012-12-30 SURGERY — GASTRECTOMY, SLEEVE, LAPAROSCOPIC
Anesthesia: General | Wound class: Clean Contaminated

## 2012-12-30 MED ORDER — SCOPOLAMINE 1 MG/3DAYS TD PT72
MEDICATED_PATCH | TRANSDERMAL | Status: DC | PRN
  Administered 2012-12-30: 1 via TRANSDERMAL

## 2012-12-30 MED ORDER — PROPOFOL 10 MG/ML IV BOLUS
INTRAVENOUS | Status: DC | PRN
  Administered 2012-12-30: 200 mg via INTRAVENOUS

## 2012-12-30 MED ORDER — MORPHINE SULFATE 2 MG/ML IJ SOLN
2.0000 mg | INTRAMUSCULAR | Status: DC | PRN
  Administered 2012-12-30: 4 mg via INTRAVENOUS
  Administered 2012-12-31: 2 mg via INTRAVENOUS
  Administered 2012-12-31: 4 mg via INTRAVENOUS
  Filled 2012-12-30: qty 1
  Filled 2012-12-30 (×2): qty 2

## 2012-12-30 MED ORDER — EPHEDRINE SULFATE 50 MG/ML IJ SOLN
INTRAMUSCULAR | Status: DC | PRN
  Administered 2012-12-30 (×4): 5 mg via INTRAVENOUS
  Administered 2012-12-30: 10 mg via INTRAVENOUS
  Administered 2012-12-30: 5 mg via INTRAVENOUS

## 2012-12-30 MED ORDER — KETAMINE HCL 10 MG/ML IJ SOLN
INTRAMUSCULAR | Status: DC | PRN
  Administered 2012-12-30: 20 mg via INTRAVENOUS

## 2012-12-30 MED ORDER — ROCURONIUM BROMIDE 100 MG/10ML IV SOLN
INTRAVENOUS | Status: DC | PRN
  Administered 2012-12-30: 5 mg via INTRAVENOUS
  Administered 2012-12-30: 50 mg via INTRAVENOUS
  Administered 2012-12-30: 10 mg via INTRAVENOUS

## 2012-12-30 MED ORDER — MORPHINE SULFATE 4 MG/ML IJ SOLN
INTRAMUSCULAR | Status: AC
  Administered 2012-12-30: 4 mg
  Filled 2012-12-30: qty 1

## 2012-12-30 MED ORDER — UNJURY CHICKEN SOUP POWDER: 2.0000 [oz_av] | Freq: Four times a day (QID) | ORAL | Status: DC

## 2012-12-30 MED ORDER — LACTATED RINGERS IV SOLN
INTRAVENOUS | Status: DC | PRN
  Administered 2012-12-30 (×2): via INTRAVENOUS

## 2012-12-30 MED ORDER — PROMETHAZINE HCL 25 MG/ML IJ SOLN: 6.2500 mg | INTRAMUSCULAR | Status: DC | PRN

## 2012-12-30 MED ORDER — DEXAMETHASONE SODIUM PHOSPHATE 10 MG/ML IJ SOLN
INTRAMUSCULAR | Status: DC | PRN
  Administered 2012-12-30: 10 mg via INTRAVENOUS

## 2012-12-30 MED ORDER — HEPARIN SODIUM (PORCINE) 5000 UNIT/ML IJ SOLN
5000.0000 [IU] | Freq: Three times a day (TID) | INTRAMUSCULAR | Status: DC
  Administered 2012-12-30 – 2013-01-01 (×5): 5000 [IU] via SUBCUTANEOUS
  Filled 2012-12-30 (×9): qty 1

## 2012-12-30 MED ORDER — UNJURY VANILLA POWDER: 2.0000 [oz_av] | Freq: Four times a day (QID) | ORAL | Status: DC

## 2012-12-30 MED ORDER — LEVOFLOXACIN IN D5W 750 MG/150ML IV SOLN
750.0000 mg | INTRAVENOUS | Status: DC
  Filled 2012-12-30: qty 150

## 2012-12-30 MED ORDER — HYDROMORPHONE HCL PF 1 MG/ML IJ SOLN
0.2500 mg | INTRAMUSCULAR | Status: DC | PRN
  Administered 2012-12-30 (×3): 0.25 mg via INTRAVENOUS

## 2012-12-30 MED ORDER — 0.9 % SODIUM CHLORIDE (POUR BTL) OPTIME
TOPICAL | Status: DC | PRN
  Administered 2012-12-30: 1000 mL

## 2012-12-30 MED ORDER — METOCLOPRAMIDE HCL 5 MG/ML IJ SOLN
INTRAMUSCULAR | Status: DC | PRN
  Administered 2012-12-30: 10 mg via INTRAVENOUS

## 2012-12-30 MED ORDER — ACETAMINOPHEN 160 MG/5ML PO SOLN
650.0000 mg | ORAL | Status: DC | PRN
  Filled 2012-12-30: qty 20.3

## 2012-12-30 MED ORDER — STERILE WATER FOR IRRIGATION IR SOLN
Status: DC | PRN
  Administered 2012-12-30: 1500 mL

## 2012-12-30 MED ORDER — PHENYLEPHRINE HCL 10 MG/ML IJ SOLN
INTRAMUSCULAR | Status: DC | PRN
  Administered 2012-12-30: 80 ug via INTRAVENOUS
  Administered 2012-12-30: 40 ug via INTRAVENOUS
  Administered 2012-12-30 (×3): 80 ug via INTRAVENOUS

## 2012-12-30 MED ORDER — LEVOFLOXACIN IN D5W 750 MG/150ML IV SOLN
750.0000 mg | INTRAVENOUS | Status: AC
  Administered 2012-12-30: 750 mg via INTRAVENOUS

## 2012-12-30 MED ORDER — LIDOCAINE HCL (CARDIAC) 20 MG/ML IV SOLN
INTRAVENOUS | Status: DC | PRN
  Administered 2012-12-30: 100 mg via INTRAVENOUS

## 2012-12-30 MED ORDER — FENTANYL CITRATE 0.05 MG/ML IJ SOLN
INTRAMUSCULAR | Status: DC | PRN
  Administered 2012-12-30 (×4): 50 ug via INTRAVENOUS

## 2012-12-30 MED ORDER — HEPARIN SODIUM (PORCINE) 5000 UNIT/ML IJ SOLN
5000.0000 [IU] | INTRAMUSCULAR | Status: AC
  Administered 2012-12-30: 5000 [IU] via SUBCUTANEOUS
  Filled 2012-12-30: qty 1

## 2012-12-30 MED ORDER — BUPIVACAINE-EPINEPHRINE PF 0.25-1:200000 % IJ SOLN
INTRAMUSCULAR | Status: DC | PRN
  Administered 2012-12-30: 20 mL

## 2012-12-30 MED ORDER — NEOSTIGMINE METHYLSULFATE 1 MG/ML IJ SOLN
INTRAMUSCULAR | Status: DC | PRN
  Administered 2012-12-30: 5 mg via INTRAVENOUS

## 2012-12-30 MED ORDER — KCL IN DEXTROSE-NACL 20-5-0.9 MEQ/L-%-% IV SOLN
INTRAVENOUS | Status: DC
  Administered 2012-12-30 – 2012-12-31 (×2): via INTRAVENOUS
  Administered 2012-12-31: 100 mL/h via INTRAVENOUS
  Filled 2012-12-30 (×7): qty 1000

## 2012-12-30 MED ORDER — GLYCOPYRROLATE 0.2 MG/ML IJ SOLN
INTRAMUSCULAR | Status: DC | PRN
  Administered 2012-12-30: 0.6 mg via INTRAVENOUS

## 2012-12-30 MED ORDER — LACTATED RINGERS IR SOLN
Status: DC | PRN
  Administered 2012-12-30: 1000 mL

## 2012-12-30 MED ORDER — MIDAZOLAM HCL 5 MG/5ML IJ SOLN
INTRAMUSCULAR | Status: DC | PRN
  Administered 2012-12-30: 2 mg via INTRAVENOUS

## 2012-12-30 MED ORDER — ONDANSETRON HCL 4 MG/2ML IJ SOLN
INTRAMUSCULAR | Status: DC | PRN
  Administered 2012-12-30: 4 mg via INTRAVENOUS

## 2012-12-30 MED ORDER — ALBUTEROL SULFATE HFA 108 (90 BASE) MCG/ACT IN AERS
INHALATION_SPRAY | RESPIRATORY_TRACT | Status: DC | PRN
  Administered 2012-12-30: 5 via RESPIRATORY_TRACT

## 2012-12-30 MED ORDER — SUCCINYLCHOLINE CHLORIDE 20 MG/ML IJ SOLN
INTRAMUSCULAR | Status: DC | PRN
  Administered 2012-12-30: 200 mg via INTRAVENOUS

## 2012-12-30 MED ORDER — ONDANSETRON HCL 4 MG/2ML IJ SOLN
4.0000 mg | INTRAMUSCULAR | Status: DC | PRN
  Administered 2012-12-30 – 2012-12-31 (×2): 4 mg via INTRAVENOUS
  Filled 2012-12-30 (×2): qty 2

## 2012-12-30 MED ORDER — PHENYLEPHRINE HCL 10 MG/ML IJ SOLN
10.0000 mg | INTRAVENOUS | Status: DC | PRN
  Administered 2012-12-30: 50 ug/min via INTRAVENOUS

## 2012-12-30 MED ORDER — UNJURY CHOCOLATE CLASSIC POWDER
2.0000 [oz_av] | Freq: Four times a day (QID) | ORAL | Status: DC
  Administered 2013-01-01: 2 [oz_av] via ORAL

## 2012-12-30 MED ORDER — OXYCODONE-ACETAMINOPHEN 5-325 MG/5ML PO SOLN
5.0000 mL | ORAL | Status: DC | PRN
  Administered 2012-12-31: 5 mL via ORAL
  Filled 2012-12-30: qty 5

## 2012-12-30 MED ORDER — TISSEEL VH 10 ML EX KIT
PACK | CUTANEOUS | Status: DC | PRN
  Administered 2012-12-30: 10 mL

## 2012-12-30 SURGICAL SUPPLY — 56 items
ADH SKN CLS APL DERMABOND .7 (GAUZE/BANDAGES/DRESSINGS) ×2
APL SRG 32X5 SNPLK LF DISP (MISCELLANEOUS) ×1
APPLICATOR COTTON TIP 6IN STRL (MISCELLANEOUS) IMPLANT
APPLIER CLIP ROT 10 11.4 M/L (STAPLE)
APPLIER CLIP ROT 13.4 12 LRG (CLIP) ×2
APR CLP LRG 13.4X12 ROT 20 MLT (CLIP) ×1
APR CLP MED LRG 11.4X10 (STAPLE)
BAG SPEC RTRVL LRG 6X4 10 (ENDOMECHANICALS)
CABLE HIGH FREQUENCY MONO STRZ (ELECTRODE) IMPLANT
CANISTER SUCTION 2500CC (MISCELLANEOUS) ×2 IMPLANT
CHLORAPREP W/TINT 26ML (MISCELLANEOUS) ×4 IMPLANT
CLIP APPLIE ROT 10 11.4 M/L (STAPLE) IMPLANT
CLIP APPLIE ROT 13.4 12 LRG (CLIP) IMPLANT
CLOTH BEACON ORANGE TIMEOUT ST (SAFETY) ×2 IMPLANT
DERMABOND ADVANCED (GAUZE/BANDAGES/DRESSINGS) ×2
DERMABOND ADVANCED .7 DNX12 (GAUZE/BANDAGES/DRESSINGS) IMPLANT
DEVICE SUTURE ENDOST 10MM (ENDOMECHANICALS) IMPLANT
DRAIN CHANNEL 19F RND (DRAIN) ×2 IMPLANT
DRAPE LAPAROSCOPIC ABDOMINAL (DRAPES) ×2 IMPLANT
DRAPE UTILITY XL STRL (DRAPES) ×4 IMPLANT
ELECT REM PT RETURN 9FT ADLT (ELECTROSURGICAL) ×2
ELECTRODE REM PT RTRN 9FT ADLT (ELECTROSURGICAL) ×1 IMPLANT
EVACUATOR DRAINAGE 10X20 100CC (DRAIN) ×1 IMPLANT
EVACUATOR SILICONE 100CC (DRAIN) ×4 IMPLANT
GLOVE SURG SS PI 7.5 STRL IVOR (GLOVE) ×4 IMPLANT
GOWN STRL REIN XL XLG (GOWN DISPOSABLE) ×8 IMPLANT
HOVERMATT SINGLE USE (MISCELLANEOUS) ×2 IMPLANT
KIT BASIN OR (CUSTOM PROCEDURE TRAY) ×2 IMPLANT
MARKER SKIN DUAL TIP RULER LAB (MISCELLANEOUS) ×2 IMPLANT
NDL SPNL 22GX3.5 QUINCKE BK (NEEDLE) ×1 IMPLANT
NEEDLE SPNL 22GX3.5 QUINCKE BK (NEEDLE) ×2 IMPLANT
NS IRRIG 1000ML POUR BTL (IV SOLUTION) ×2 IMPLANT
PENCIL BUTTON HOLSTER BLD 10FT (ELECTRODE) ×2 IMPLANT
POUCH SPECIMEN RETRIEVAL 10MM (ENDOMECHANICALS) IMPLANT
RELOAD BLUE (STAPLE) ×4 IMPLANT
RELOAD GREEN (STAPLE) ×1 IMPLANT
SCISSORS LAP 5X35 DISP (ENDOMECHANICALS) ×1 IMPLANT
SEALANT SURGICAL APPL DUAL CAN (MISCELLANEOUS) ×2 IMPLANT
SET IRRIG TUBING LAPAROSCOPIC (IRRIGATION / IRRIGATOR) ×2 IMPLANT
SHEARS CURVED HARMONIC AC 45CM (MISCELLANEOUS) ×2 IMPLANT
SLEEVE ENDOPATH XCEL 5M (ENDOMECHANICALS) ×6 IMPLANT
SOLUTION ANTI FOG 6CC (MISCELLANEOUS) ×2 IMPLANT
SPONGE GAUZE 4X4 12PLY (GAUZE/BANDAGES/DRESSINGS) IMPLANT
SPONGE LAP 18X18 X RAY DECT (DISPOSABLE) ×2 IMPLANT
STAPLE ECHEON FLEX 60 POW ENDO (STAPLE) IMPLANT
SUT ETHILON 2 0 PS N (SUTURE) ×1 IMPLANT
SUT MNCRL AB 4-0 PS2 18 (SUTURE) ×2 IMPLANT
SUT VICRYL 0 UR6 27IN ABS (SUTURE) ×1 IMPLANT
TOWEL OR 17X26 10 PK STRL BLUE (TOWEL DISPOSABLE) ×2 IMPLANT
TRAY FOLEY CATH 14FRSI W/METER (CATHETERS) ×2 IMPLANT
TRAY LAP CHOLE (CUSTOM PROCEDURE TRAY) ×2 IMPLANT
TROCAR BLADELESS 15MM (ENDOMECHANICALS) ×2 IMPLANT
TROCAR BLADELESS OPT 5 100 (ENDOMECHANICALS) ×3 IMPLANT
TUBING CONNECTING 10 (TUBING) ×2 IMPLANT
TUBING ENDO SMARTCAP (MISCELLANEOUS) ×2 IMPLANT
TUBING FILTER THERMOFLATOR (ELECTROSURGICAL) ×2 IMPLANT

## 2012-12-30 NOTE — Anesthesia Postprocedure Evaluation (Signed)
  Anesthesia Post-op Note  Patient: Robin Cox  Procedure(s) Performed: Procedure(s) (LRB): LAPAROSCOPIC GASTRIC SLEEVE RESECTION (N/A) ESOPHAGOGASTRODUODENOSCOPY (EGD) (N/A)  Patient Location: PACU  Anesthesia Type: General  Level of Consciousness: awake and alert   Airway and Oxygen Therapy: Patient Spontanous Breathing  Post-op Pain: mild  Post-op Assessment: Post-op Vital signs reviewed, Patient's Cardiovascular Status Stable, Respiratory Function Stable, Patent Airway and No signs of Nausea or vomiting  Last Vitals:  Filed Vitals:   12/30/12 1130  BP: 113/62  Pulse: 91  Temp:   Resp: 19    Post-op Vital Signs: stable   Complications: No apparent anesthesia complications. No observed apnea in PACU. OSA precautions on floor.

## 2012-12-30 NOTE — Op Note (Signed)
Preoperative Diagnosis: morbid obesity  Postoprative Diagnosis: morbid obesity  Procedure: Procedure(s): LAPAROSCOPIC GASTRIC SLEEVE RESECTION ESOPHAGOGASTRODUODENOSCOPY (EGD)   Surgeon: Glenna Fellows T   Assistants: Lodema Pilot  Anesthesia:  General endotracheal anesthesia  Indications: patient is a 42 year old female with progressive morbid obesity unresponsive to medical management who presents with a BMI of 48 and multiple comorbidities as detailed elsewhere in her record. After extensive preoperative workup and discussion of options and risks detailed elsewhere we have elected to proceed with laparoscopic sleeve gastrectomy for treatment of her morbid obesity.  Procedure Detail:  Patient was brought to the operating room, placed in the supine position on the operating table, and general endotracheal anesthesia induced. The abdomen was widely sterilely prepped and draped. She did receive preoperative IV antibiotics and subcutaneous heparin. PAS port in place. Patient time out was performed and correct procedure verified. Access was obtained with a 5 mm Optiview trocar in the left upper quadrant without difficulty and pneumoperitoneum established. Under direct vision a 5 mm trocar was placed laterally in the right upper quadrant, a 15 mm trocar in the right upper quadrant midclavicular line and a 5 mm trocar just to the left of the umbilicus for the camera port. There were noted to be adhesions to the left lobe of the liver which had it fixed up to the diaphragm providing actually excellent liver retraction with good exposure of the entire stomach and the liver retractor was necessary. The pylorus was identified and a measured 5 cm from the pylorus. At this point the lesser omentum was divided just inferior to the greater curve of the stomach with the harmonic scalpel. The free lesser sac was entered. We then progressed proximally along the greater curve of the stomach separating the lesser  omentum with Harmonic scalpel. The dissection progressed proximally and short gastric vessels were sequentially divided along the greater curve working up toward the angle of Hiss. The stomach was fairly closely applied to the spleen near the angle of Hiss which was dissected away with the harmonic scalpel. The fundus was completely freed and dissection carried up to the angle of Hiss and the left crus identified and the stomach completely dissected down and away from the left crus. Working back distally the stomach was completely free over to the lesser curve with minimal retrogastric adhesions. There were a few down along the antrum that were taken down completely mobilizing the stomach to the lesser curve. An initial firing of the green load echelon 60 mm stapler was used beginning 5 cm proximal to the pylorus and extending up toward but staying away from the incisura. Following this blue loads were used and a second stapler was applied and a 36 bougie dilator passed orally down through the stomach to the pylorus. The second stapler was fired at this point. Several more firings of the 60 mm blue load stapler were then used staying close to but not impinging upon the bougie making sure the staple line was straight and symmetric to the anterior and posterior stomach prior to firing. A final firing was used up through the angle of his staying just lateral to the esophageal fat pad and the specimen separated. The gastric tube was seen to be nicely symmetrical with a smooth staple line and no spiraling.  There was a tiny area of oozing right at the very top of the staple line at the angle of Hiss which was controlled with a single clip. There was no other bleeding. The specimen was removed  by sharply excising the staple line from the specimen and removing this with a 15 mm trocar. The remainder of the specimen was then cut in a spiral fashion and removed through the 15 mm trocar. The abdomen was thoroughly irrigated  there was no evidence of bleeding. Dr. Biagio Quint then performed upper endoscopy and with the pylorus occluded and under saline irrigation there was no evidence of leak. No evidence of bleeding. The gastric tube appeared symmetrical without stricture. The stomach was desufflated and the staple line coated with Tisseel tissue sealant. All CO2 was evacuated and trochars removed. Skin incisions were closed with subcuticular Monocryl and Dermabond. Sponge needle and instrument counts were correct.   Estimated Blood Loss:  less than 100 mL         Drains: none  Blood Given: none          Specimens: gastric sleeve        Complications:  * No complications entered in OR log *         Disposition: PACU - hemodynamically stable.         Condition: stable

## 2012-12-30 NOTE — Transfer of Care (Signed)
Immediate Anesthesia Transfer of Care Note  Patient: Robin Cox  Procedure(s) Performed: Procedure(s) (LRB): LAPAROSCOPIC GASTRIC SLEEVE RESECTION (N/A) ESOPHAGOGASTRODUODENOSCOPY (EGD) (N/A)  Patient Location: PACU  Anesthesia Type: General  Level of Consciousness: sedated, patient cooperative and responds to stimulaton  Airway & Oxygen Therapy: Patient Spontanous Breathing and Patient connected to face mask oxgen  Post-op Assessment: Report given to PACU RN and Post -op Vital signs reviewed and stable  Post vital signs: Reviewed and stable  Complications: No apparent anesthesia complications

## 2012-12-30 NOTE — H&P (View-Only) (Signed)
Chief complaint: Preop sleeve gastrectomy  History: Patient returns for her preop visit prior to planned laparoscopic sleeve gastrectomy for morbid obesity next week. She has completed her preoperative workup. We reviewed her psych and nutrition evaluations which were unremarkable. Upper GI series was normal. Lab work was unremarkable. We completely reviewed the consent form and all her questions were answered.  Past Medical History  Diagnosis Date  . Hypertension   . Allergy   . Sleep apnea   . Morbid obesity   . Shortness of breath oct 2013    pt given inhaler for use as needed for wheezing and sob-that has helped - no diagnosis of asthma or respiratory problems  . Headache(784.0)     h/a's prior to starting b/p medication - no problems since  . Anemia     pt on iron pills   Past Surgical History  Procedure Laterality Date  . Dilation and curettage of uterus  1990   Current Outpatient Prescriptions  Medication Sig Dispense Refill  . albuterol (PROVENTIL HFA;VENTOLIN HFA) 108 (90 BASE) MCG/ACT inhaler Inhale 2 puffs into the lungs every 6 (six) hours as needed for wheezing.      . Fe Fum-FePoly-Vit C-Vit B3 (INTEGRA) 62.5-62.5-40-3 MG CAPS Take by mouth. Every am      . hydrochlorothiazide (HYDRODIURIL) 25 MG tablet Take 25 mg by mouth every evening.      . loratadine (CLARITIN) 10 MG tablet Take 10 mg by mouth every evening.      . olmesartan (BENICAR) 20 MG tablet Take 20 mg by mouth every evening.      . potassium chloride (K-DUR,KLOR-CON) 10 MEQ tablet Take 10 mEq by mouth every evening.       No current facility-administered medications for this visit.   Allergies  Allergen Reactions  . Augmentin (Amoxicillin-Pot Clavulanate) Nausea And Vomiting  . Azithromycin Nausea And Vomiting    Got extremely hot    Exam: BP 124/68  Pulse 88  Resp 16  Ht 5' 1.5" (1.562 m)  Wt 262 lb 9.6 oz (119.115 kg)  BMI 48.82 kg/m2  LMP 12/13/2012 General: Morbidly obese otherwise  well-appearing [place] female Skin: Right ear infection HEENT: Sclerae not icteric. No masses Lungs: Clear equal breath sounds bilaterally Cardiac: Regular rate and rhythm. No edema. Abdomen: Soft and nontender. No discernible masses or organomegaly Extremities: No swelling or edema Neurologic: Alert and fully oriented. Gait normal.  Assessment and plan: Morbid obesity with comorbidities of obstructive sleep apnea and hypertension and history of lower trinity edema. She is on her preoperative diet. Procedure reviewed and all questions answered as above. 

## 2012-12-30 NOTE — Op Note (Signed)
NAMEADDISSON, FRATE               ACCOUNT NO.:  [id number]  MEDICAL RECORD NO.:  [id number]  LOCATION:  1530                         FACILITY:  [facility]  PHYSICIAN:  Lodema Pilot, MD       DATE OF BIRTH:  [birth year]  DATE OF PROCEDURE:  12/30/2012 DATE OF DISCHARGE:                              OPERATIVE REPORT   PREOPERATIVE DIAGNOSIS:  Morbid obesity.  POSTOPERATIVE DIAGNOSIS:  Morbid obesity.  PROCEDURE:  Intraoperative esophagogastroduodenoscopy.  SURGEON:  Lodema Pilot, MD  ASSISTANT:  Lorne Skeens. Hoxworth, M.D.  ANESTHESIA:  General endotracheal tube anesthesia.  FLUIDS:  Please see the anesthesia record.  ESTIMATED BLOOD LOSS:  None.  SPECIMENS:  None.  COMPLICATIONS:  None apparent.  FINDINGS:  Normal-appearing sleeve gastrectomy.  Normal-appearing esophagus, stomach, and duodenum.  Z-line appeared normal at 37 cm.  No evidence of hiatal hernia.  No evidence of intraoperative leakage.  INDICATION FOR PROCEDURE:  Ms. Middendorf is a 42 year old female with morbid obesity, who is undergoing vertical sleeve gastrectomy for obesity.  I was asked to perform intraoperative endoscopy for intraoperative leak test.  OPERATIVE DETAILS:  Ms. Wigington was already asleep with general anesthesia, undergoing vertical sleeve gastrectomy by Dr. Johna Sheriff. They asked me to perform upper endoscopy to visualize the sleeve and perform intraoperative leak test.  A well lubricated fiberoptic endoscope was passed without difficulty on the first attempt into the esophagus and was passed, slowly driven down the esophagus which appeared normal without any mucosal lesions.  The Z-line was noted to be normal at about 37 cm.  Shortly after entering the stomach sleeve, the staple line was noted.  There was not a significant pouch proximally.  I passed the scope without difficulty, passed the angularis and into the antrum.  The distal staple line was identified.  The antrum appeared normal without  any mucosal lesions.  The pylorus was visualized and I passed the scope into the duodenum and normal duodenal bulb with bile- stained mucosa was identified.  The scope was withdrawn into the antrum and the stomach was insufflated while Dr. Johna Sheriff had the stomach submerged under water.  There was no evidence of intraoperative leakage.  There was no intraluminal bleeding.  The air was suctioned, and the scope was slowly removed.  There was no obvious hiatal hernia, and again the scope was removed.  The patient tolerated the procedure well without apparent complication.          ______________________________ Lodema Pilot, MD     BL/MEDQ  D:  12/30/2012  T:  12/30/2012  Job:  [id number]

## 2012-12-30 NOTE — Anesthesia Preprocedure Evaluation (Addendum)
Anesthesia Evaluation  Patient identified by MRN, date of birth, ID band Patient awake    Reviewed: Allergy & Precautions, H&P , NPO status , Patient's Chart, lab work & pertinent test results  Airway Mallampati: II TM Distance: >3 FB Neck ROM: Full    Dental no notable dental hx.    Pulmonary shortness of breath, sleep apnea ,  Denies asthma. States she has occasional SOB with anxiety. She had a stress test which was normal per patient. breath sounds clear to auscultation  Pulmonary exam normal       Cardiovascular Exercise Tolerance: Good hypertension, Pt. on medications negative cardio ROS  + dysrhythmias Rhythm:Regular Rate:Normal  Cardiology note of 10-27-12 and stress ECHO 11-07-12 reviewed.   Neuro/Psych  Headaches, negative psych ROS   GI/Hepatic negative GI ROS, Neg liver ROS,   Endo/Other  Morbid obesity  Renal/GU negative Renal ROS  negative genitourinary   Musculoskeletal negative musculoskeletal ROS (+)   Abdominal (+) + obese,   Peds negative pediatric ROS (+)  Hematology negative hematology ROS (+)   Anesthesia Other Findings   Reproductive/Obstetrics Negative pregnancy test.                         Anesthesia Physical Anesthesia Plan  ASA: III  Anesthesia Plan: General   Post-op Pain Management:    Induction: Intravenous  Airway Management Planned: Oral ETT  Additional Equipment:   Intra-op Plan:   Post-operative Plan: Extubation in OR  Informed Consent: I have reviewed the patients History and Physical, chart, labs and discussed the procedure including the risks, benefits and alternatives for the proposed anesthesia with the patient or authorized representative who has indicated his/her understanding and acceptance.   Dental advisory given  Plan Discussed with: CRNA  Anesthesia Plan Comments: (Discussed emergence with nasal airway in place.)        Anesthesia Quick Evaluation

## 2012-12-30 NOTE — Interval H&P Note (Signed)
History and Physical Interval Note:  12/30/2012 7:09 AM  Robin Cox  has presented today for surgery, with the diagnosis of morbid obesity  The various methods of treatment have been discussed with the patient and family. After consideration of risks, benefits and other options for treatment, the patient has consented to  Procedure(s) with comments: LAPAROSCOPIC GASTRIC SLEEVE RESECTION (N/A) - Laparoscopic Sleeve Gastrectomy  ESOPHAGOGASTRODUODENOSCOPY (EGD) (N/A) as a surgical intervention .  The patient's history has been reviewed, patient examined, no change in status, stable for surgery.  I have reviewed the patient's chart and labs.  Questions were answered to the patient's satisfaction.     [name] T

## 2012-12-31 ENCOUNTER — Encounter (HOSPITAL_COMMUNITY): Payer: Self-pay | Admitting: General Surgery

## 2012-12-31 ENCOUNTER — Inpatient Hospital Stay (HOSPITAL_COMMUNITY): Payer: 59

## 2012-12-31 LAB — CBC WITH DIFFERENTIAL/PLATELET
Basophils Absolute: 0 10*3/uL (ref 0.0–0.1)
HCT: 32 % — ABNORMAL LOW (ref 36.0–46.0)
Hemoglobin: 9.8 g/dL — ABNORMAL LOW (ref 12.0–15.0)
Lymphocytes Relative: 9 % — ABNORMAL LOW (ref 12–46)
Monocytes Absolute: 0.8 10*3/uL (ref 0.1–1.0)
Neutro Abs: 11.7 10*3/uL — ABNORMAL HIGH (ref 1.7–7.7)
Neutrophils Relative %: 85 % — ABNORMAL HIGH (ref 43–77)
RDW: 16.2 % — ABNORMAL HIGH (ref 11.5–15.5)
WBC: 13.7 10*3/uL — ABNORMAL HIGH (ref 4.0–10.5)

## 2012-12-31 LAB — HEMOGLOBIN AND HEMATOCRIT, BLOOD: HCT: 31.5 % — ABNORMAL LOW (ref 36.0–46.0)

## 2012-12-31 MED ORDER — IOHEXOL 300 MG/ML  SOLN
50.0000 mL | Freq: Once | INTRAMUSCULAR | Status: AC | PRN
  Administered 2012-12-31: 50 mL via ORAL

## 2012-12-31 NOTE — Care Management Note (Signed)
    Page 1 of 1   12/31/2012     11:24:12 AM   CARE MANAGEMENT NOTE 12/31/2012  Patient:  Robin Cox, Robin Cox   Account Number:  [id number]  Date Initiated:  12/31/2012  Documentation initiated by:  Lorenda Ishihara  Subjective/Objective Assessment:   42 yo female admitted s/p gastric sleeve. PTA lived at home alone.     Action/Plan:   Home when stable   Anticipated DC Date:  01/03/2013   Anticipated DC Plan:  HOME/SELF CARE      DC Planning Services  CM consult      Choice offered to / List presented to:             Status of service:  Completed, signed off Medicare Important Message given?   (If response is "NO", the following Medicare IM given date fields will be blank) Date Medicare IM given:   Date Additional Medicare IM given:    Discharge Disposition:  HOME/SELF CARE  Per UR Regulation:  Reviewed for med. necessity/level of care/duration of stay  If discussed at Long Length of Stay Meetings, dates discussed:    Comments:

## 2012-12-31 NOTE — Progress Notes (Signed)
Spoke with pt regarding cpap.  Pt stated she is not ready for bed at this time.  Pt said the cpap machine is like hers at home and feels comfortable placing it on herself later when ready.  Pt has her nasal pillows and tubing from home.  2l O2 bled in.  Pt was advised that RT available all night should she need further assistance.  RN notified.

## 2012-12-31 NOTE — Progress Notes (Signed)
Patient ID: Robin Cox, female   DOB: 10-31-1970, 42 y.o.   MRN: [id number] 1 Day Post-Op  Subjective: No C/O, denies pain or nausea, ambulatory  Objective: Vital signs in last 24 hours: Temp:  [98 F (36.7 C)-98.7 F (37.1 C)] 98.5 F (36.9 C) (08/06 0539) Pulse Rate:  [77-107] 78 (08/06 0539) Resp:  [16-30] 18 (08/06 0539) BP: (104-138)/(53-80) 119/68 mmHg (08/06 0539) SpO2:  [93 %-100 %] 98 % (08/06 0539) Last BM Date: 12/30/12  Intake/Output from previous day: 08/05 0701 - 08/06 0700 In: 3861.7 [P.O.:80; I.V.:3781.7] Out: 850 [Urine:850] Intake/Output this shift:    General appearance: alert, cooperative and no distress Resp: clear to auscultation bilaterally GI: normal findings: soft, non-tender Incision/Wound: Clean and dry  Lab Results:   Recent Labs  12/30/12 1918 12/31/12 0355  WBC  --  13.7*  HGB 10.4* 9.8*  HCT 32.9* 32.0*  PLT  --  280   BMET No results found for this basename: NA, K, CL, CO2, GLUCOSE, BUN, CREATININE, CALCIUM,  in the last 72 hours   Studies/Results: No results found.  Anti-infectives: Anti-infectives   Start     Dose/Rate Route Frequency Ordered Stop   12/30/12 0700  levofloxacin (LEVAQUIN) IVPB 750 mg  Status:  Discontinued     750 mg 100 mL/hr over 90 Minutes Intravenous To Surgery 12/30/12 0655 12/30/12 1145   12/30/12 0511  levofloxacin (LEVAQUIN) IVPB 750 mg     750 mg 100 mL/hr over 90 Minutes Intravenous On call to O.R. 12/30/12 0511 12/30/12 0732      Assessment/Plan: s/p Procedure(s): LAPAROSCOPIC GASTRIC SLEEVE RESECTION ESOPHAGOGASTRODUODENOSCOPY (EGD) Doing well UGI results pending Increase activity, pulm toilet   LOS: 1 day    Alex Leahy T 12/31/2012

## 2012-12-31 NOTE — Progress Notes (Addendum)
Spoke with patient regarding cpap.  Pt stated she started using cpap at home a few months ago.  Cpap order added to chart.  Pt thinks her cpap pressure is 8.1, however unable to tolerate this pressure when set on hospital-provided machine.  Cpap pressure adjusted down to 7cm h2o per comfort/request with 2l o2 bleedin.  Pt is using her nasal pillows and tubing from home and tolerating well at this time.  RN notified.  Sterile water added to fill line of humidity chamber.  HR 77, sats100%.

## 2013-01-01 LAB — CBC WITH DIFFERENTIAL/PLATELET
Basophils Absolute: 0 10*3/uL (ref 0.0–0.1)
Eosinophils Relative: 1 % (ref 0–5)
Lymphocytes Relative: 36 % (ref 12–46)
Lymphs Abs: 3.8 10*3/uL (ref 0.7–4.0)
MCV: 86.9 fL (ref 78.0–100.0)
Neutro Abs: 5.9 10*3/uL (ref 1.7–7.7)
Neutrophils Relative %: 56 % (ref 43–77)
Platelets: 287 10*3/uL (ref 150–400)
RBC: 3.58 MIL/uL — ABNORMAL LOW (ref 3.87–5.11)
RDW: 16.6 % — ABNORMAL HIGH (ref 11.5–15.5)
WBC: 10.4 10*3/uL (ref 4.0–10.5)

## 2013-01-01 MED ORDER — OXYCODONE-ACETAMINOPHEN 5-325 MG/5ML PO SOLN
5.0000 mL | ORAL | Status: DC | PRN
Start: 2013-01-01 — End: 2013-01-14

## 2013-01-01 NOTE — Progress Notes (Signed)
Assessment unchanged. Tolerated protein shake. No c/o pain. Scripts x 1 given as provided by MD. Pt verbalized understanding of dc instructions through teach back. Pt currently had My Chart account and verbalized to nurse about it. Discharged via wheelchair to front entrance to meet awaiting vehicle to carry home. Accompanied by family and NT.

## 2013-01-01 NOTE — Discharge Summary (Signed)
   Patient ID: Robin Cox [id number] 42 y.o. 02-13-1971  12/30/2012  Discharge date and time: 01/01/2013   Admitting Physician: Glenna Fellows T  Discharge Physician: Glenna Fellows T  Admission Diagnoses: morbid obesity  Discharge Diagnoses: morbid obesity  Operations: Procedure(s): LAPAROSCOPIC GASTRIC SLEEVE RESECTION ESOPHAGOGASTRODUODENOSCOPY (EGD)  Admission Condition: good  Discharged Condition: good  Indication for Admission: patient is a 42 year old female electively admitted for laparoscopic sleeve gastrectomy for progressive morbid obesity unresponsive to medical management.  Hospital Course: on the morning of admission the patient underwent an uneventful laparoscopic sleeve gastrectomy. Her postoperative course was smooth. The first postoperative day Gastrografin swallow showed no leak or obstruction. She was started on a clear liquid diet which he tolerated well. She had some airway congestion and low O2 sats which improved with pulmonary toilet. On the second postoperative day she is afebrile with normal vital signs. O2 sats on room air 92%. Lungs are clear and abdomen is soft and nontender. Wounds healing well. CBC shows a normal white count and hemoglobin of 9.5. She'll be discharged later today if tolerating protein shakes well   Disposition: Home  Patient Instructions:    Medication List         albuterol 108 (90 BASE) MCG/ACT inhaler  Commonly known as:  PROVENTIL HFA;VENTOLIN HFA  Inhale 2 puffs into the lungs every 6 (six) hours as needed for wheezing.     hydrochlorothiazide 25 MG tablet  Commonly known as:  HYDRODIURIL  Take 25 mg by mouth every evening.     INTEGRA 62.5-62.5-40-3 MG Caps  Take by mouth. Every am     loratadine 10 MG tablet  Commonly known as:  CLARITIN  Take 10 mg by mouth every evening.     olmesartan 20 MG tablet  Commonly known as:  BENICAR  Take 20 mg by mouth every evening.     oxyCODONE-acetaminophen 5-325  MG/5ML solution  Commonly known as:  ROXICET  Take 5-10 mLs by mouth every 4 (four) hours as needed.     potassium chloride 10 MEQ tablet  Commonly known as:  K-DUR,KLOR-CON  Take 10 mEq by mouth every evening.        Activity: activity as tolerated Diet: per bariatric instructions Wound Care: none needed  Follow-up:  With Dr. Johna Sheriff in 3 weeks.  Signed: Mariella Saa MD, FACS  01/01/2013, 8:13 AM

## 2013-01-01 NOTE — Progress Notes (Signed)
Patient alert and oriented, pain is controlled. Patient is tolerating fluids, plan to advance to protein shake today.  Reviewed Gastric sleeve discharge instructions with patient and patient is able to articulate understanding.    GASTRIC BYPASS / SLEEVE  Home Care Instructions  These instructions are to help you care for yourself when you go home.  Call: If you have any problems.   Call [phone] and ask for the surgeon on call   If you need immediate assistance come to the ER at [facility]. Tell the ER staff that you are a new post-op gastric bypass or gastric sleeve patient   Signs and symptoms to report:   Severe vomiting or nausea o If you cannot handle clear liquids for longer than 1 day, call your surgeon    Abdominal pain which does not get better after taking your pain medication   Fever greater than 100.4 F and chills   Heart rate over 100 beats a minute   Trouble breathing   Chest pain    Redness, swelling, drainage, or foul odor at incision (surgical) sites    If your incisions open or pull apart   Swelling or pain in calf (lower leg)   Diarrhea (Loose bowel movements that happen often), frequent watery, uncontrolled bowel movements   Constipation, (no bowel movements for 3 days) if this happens:  o Take Milk of Magnesia, 2 tablespoons by mouth, 3 times a day for 2 days if needed o Stop taking Milk of Magnesia once you have had a bowel movement o Call your doctor if constipation continues Or o Take Miralax  (instead of Milk of Magnesia) following the label instructions o Stop taking Miralax once you have had a bowel movement o Call your doctor if constipation continues   Anything you think is "abnormal for you"   Normal side effects after surgery:   Unable to sleep at night or unable to concentrate   Irritability   Being tearful (crying) or depressed These are common complaints, possibly related to your anesthesia, stress of surgery and change in lifestyle, that  usually go away a few weeks after surgery.  If these feelings continue, call your medical doctor.  Wound Care: You may have surgical glue, steri-strips, or staples over your incisions after surgery   Surgical glue:  Looks like a clear film over your incisions and will wear off a little at a time   Steri-strips : Adhesive strips of tape over your incisions. You may notice a yellowish color on the skin under the steri-strips. This is used to make the   steri-strips stick better. Do not pull the steri-strips off - let them fall off   Staples: Staples may be removed before you leave the hospital o If you go home with staples, call [facility] at for an appointment with your surgeon's nurse to have staples removed 10 days after surgery, [phone]   Showering: You may shower two (2) days after your surgery unless your surgeon tells you differently o Wash gently around incisions with warm soapy water, rinse well, and gently pat dry  o If you have a drain (tube from your incision), you may need someone to hold this while you shower  o No tub baths until staples are removed and incisions are healed     Medications:   Medications should be liquid or crushed if larger than the size of a dime   Extended release pills (medication that releases a little bit at  a time through the day) should not be crushed   Depending on the size and number of medications you take, you may need to space (take a few throughout the day)/change the time you take your medications so that you do not over-fill your pouch (smaller stomach)   Make sure you follow-up with your primary care physician to make medication changes needed during rapid weight loss and life-style changes   If you have diabetes, follow up with the doctor that orders your diabetes medication(s) within one week after surgery and check your blood sugar regularly.   Do not drive while taking narcotics (pain medications)   Do not take acetaminophen  (Tylenol) and Roxicet or Lortab Elixir at the same time since these pain medications contain acetaminophen  Diet:                    First 2 Weeks  You will see the nutritionist about two (2) weeks after your surgery. The nutritionist will increase the types of foods you can eat if you are handling liquids well:   If you have severe vomiting or nausea and cannot handle clear liquids lasting longer than 1 day, call your surgeon  Protein Shake   Drink at least 2 ounces of shake 5-6 times per day   Each serving of protein shakes (usually 8 - 12 ounces) should have a minimum of:  o 15 grams of protein  o And no more than 5 grams of carbohydrate    Goal for protein each day: o Men = 80 grams per day o Women = 60 grams per day   Protein powder may be added to fluids such as non-fat milk or Lactaid milk or Soy milk (limit to 35 grams added protein powder per serving)  Hydration   Slowly increase the amount of water and other clear liquids as tolerated (See Acceptable Fluids)   Slowly increase the amount of protein shake as tolerated     Sip fluids slowly and throughout the day   May use sugar substitutes in small amounts (no more than 6 - 8 packets per day; i.e. Splenda)  Fluid Goal   The first goal is to drink at least 8 ounces of protein shake/drink per day (or as directed by the nutritionist); some examples of protein shakes are [employer], [employer]illard[employer], EAS Edge HP, and Unjury. See handout from pre-op Bariatric Education Class: o Slowly increase the amount of protein shake you drink as tolerated o You may find it easier to slowly sip shakes throughout the day o It is important to get your proteins in first   Your fluid goal is to drink 64 - 100 ounces of fluid daily o It may take a few weeks to build up to this   32 oz (or more) should be clear liquids  And    32 oz (or more) should be full liquids (see below for examples)   Liquids should not contain sugar, caffeine, or  carbonation  Clear Liquids:   Water or Sugar-free flavored water (i.e. Fruit H2O, Propel)   Decaffeinated coffee or tea (sugar-free)   Crystal Lite, Wyler's Lite, Minute Maid Lite   Sugar-free Jell-O   Bouillon or broth   Sugar-free Popsicle:   *Less than 20 calories each; Limit 1 per day  Full Liquids: Protein Shakes/Drinks + 2 choices per day of other full liquids   Full liquids must be: o No More Than 12 grams of Carbs per serving  o No More Than 3 grams of Fat per serving   Strained low-fat cream soup   Non-Fat milk   Fat-free Lactaid Milk   Sugar-free yogurt (Dannon Lite & Fit, Greek yogurt)      Vitamins and Minerals   Start 1 day after surgery unless otherwise directed by your surgeon   2 Chewable Multivitamin / Multimineral Supplement with iron (i.e. Centrum for Adults)   Vitamin B-12, 350 - 500 micrograms sub-lingual (place tablet under the tongue) each day   Chewable Calcium Citrate with Vitamin D-3 (Example: 3 Chewable Calcium Plus 600 with Vitamin D-3) o Take 500 mg three (3) times a day for a total of 1500 mg each day o Do not take all 3 doses of calcium at one time as it may cause constipation, and you can only absorb 500 mg  at a time  o Do not mix multivitamins containing iron with calcium supplements; take 2 hours apart o Do not substitute Tums (calcium carbonate) for your calcium   Menstruating women and those at risk for anemia (a blood disease that causes weakness) may need extra iron o Talk with your doctor to see if you need more iron   If you need extra iron: Total daily Iron recommendation (including Vitamins) is 50 to 100 mg Iron/day   Do not stop taking or change any vitamins or minerals until you talk to your nutritionist or surgeon   Your nutritionist and/or surgeon must approve all vitamin and mineral supplements   Activity and Exercise: It is important to continue walking at home.  Limit your physical activity as instructed by your doctor.  During  this time, use these guidelines:   Do not lift anything greater than ten (10) pounds for at least two (2) weeks   Do not go back to work or drive until Designer, industrial/product says you can   You may have sex when you feel comfortable  o It is VERY important for female patients to use a reliable birth control method; fertility often increases after surgery  o Do not get pregnant for at least 18 months   Start exercising as soon as your doctor tells you that you can o Make sure your doctor approves any physical activity   Start with a simple walking program   Walk 5-15 minutes each day, 7 days per week.    Slowly increase until you are walking 30-45 minutes per day Consider joining our BELT program. [phone] or email [email or web address]   Special Instructions Things to remember:   Free counseling is available for you and your family through collaboration between [facility] and [place]. Please call [phone] and leave a message   Use your CPAP when sleeping if this applies to you   [facility] has a free Bariatric Surgery Support Group that meets monthly, the 3rd Thursday, 6 pm, [facility] Classrooms You can see classes online at [email or web address]   It is very important to keep all follow up appointments with your surgeon, nutritionist, primary care physician, and behavioral health practitioner o After the first year, please follow up with your bariatric surgeon and nutritionist at least once a year in order to maintain best weight loss results [facility]: [phone] [facility]: [phone] Bariatric Nurse Coordinator: [phone]

## 2013-01-03 NOTE — Progress Notes (Addendum)
Bariatric Class:  Appt start time: 0830   End time:  0930.  Pre-Operative Nutrition Class  Patient was seen on 12/18/12 for Pre-Operative Bariatric Surgery Education [facility].   Surgery date: 12/30/12 Surgery type: Sleeve Start weight at [facility]: 271.8 lbs (10/09/12) Goal weight: 130-150 lbs  Weight today: 266.0 lbs Weight change: 5.8 lb LOSS Total weight lost: 5.8 lbs  TANITA  BODY COMP RESULTS  12/18/12   BMI (kg/m^2) 50.3   Fat Mass (lbs) 114.5   Fat Free Mass (lbs) 151.5   Total Body Water (lbs) 111.0   Samples given per MNT protocol; Patient educated on appropriate usage: Bariatric Advantage Multivitamin Lot # [id number] Exp: 06/15  Bariatric Advantage Calcium Citrate Lot # [id number] Exp: 03/15  Bariatric Advantage B12 Lot # [id number] Exp: 10/15  Celebrate Vitamins Multivitamin Lot # [id number] Exp: 07/15  Celebrate Vitamins Calcium Citrate Lot # [id number] Exp: 09/15  Corliss Marcus Protein Powder Lot # [id number] Exp: 09/15  The following the learning objectives met by the patient during this course:  Identify Pre-Op Dietary Goals and will begin 2 weeks pre-operatively  Identify appropriate sources of fluids and proteins   State protein recommendations and appropriate sources pre and post-operatively  Identify Post-Operative Dietary Goals and will follow for 2 weeks post-operatively  Identify appropriate multivitamin and calcium sources  Describe the need for physical activity post-operatively and will follow MD recommendations  State when to call healthcare provider regarding medication questions or post-operative complications  Handouts given during class include:  Pre-Op Bariatric Surgery Diet Handout  Protein Shake Handout  Post-Op Bariatric Surgery Nutrition Handout  BELT Program Information Flyer  Support Group Information Flyer  [facility] Bariatric Supplements Price List  Follow-Up Plan: Patient will follow-up  at [facility] 2 weeks post operatively for diet advancement per MD.

## 2013-01-03 NOTE — Patient Instructions (Signed)
Follow:   Pre-Op Diet per MD 2 weeks prior to surgery  Phase 2- Liquids (clear/full) 2 weeks after surgery  Vitamin/Mineral/Calcium guidelines for purchasing bariatric supplements  Exercise guidelines pre and post-op per MD  Follow-up at [facility] in 2 weeks post-op for diet advancement. Contact Jaeanna Mccomber as needed with questions/concerns. 

## 2013-01-06 ENCOUNTER — Telehealth: Payer: Self-pay | Admitting: *Deleted

## 2013-01-06 NOTE — Telephone Encounter (Signed)
Has some questions. Please call her.

## 2013-01-06 NOTE — Telephone Encounter (Signed)
Pt wants to know if she should  Continue to take her Bp medications since she had surgery. Advised pt to take all medication as prescribed unless Dr Johna Sheriff advised her differently. Also advised pt to make an appt to follow up with Dr Constance Goltz pt will call for an appt when she is able to drive

## 2013-01-07 ENCOUNTER — Telehealth (INDEPENDENT_AMBULATORY_CARE_PROVIDER_SITE_OTHER): Payer: Self-pay | Admitting: General Surgery

## 2013-01-07 NOTE — Telephone Encounter (Signed)
Pt called to ask when she can resume driving her car.  She is POD#8.  Explained she needs to be two weeks post op, must wear her seat belt comfortably and not be taking any narcotics.  She understands and will wait.

## 2013-01-13 ENCOUNTER — Encounter: Payer: 59 | Attending: General Surgery | Admitting: *Deleted

## 2013-01-13 DIAGNOSIS — E669 Obesity, unspecified: Secondary | ICD-10-CM | POA: Insufficient documentation

## 2013-01-13 DIAGNOSIS — Z713 Dietary counseling and surveillance: Secondary | ICD-10-CM | POA: Insufficient documentation

## 2013-01-14 ENCOUNTER — Encounter: Payer: Self-pay | Admitting: Internal Medicine

## 2013-01-14 ENCOUNTER — Ambulatory Visit (INDEPENDENT_AMBULATORY_CARE_PROVIDER_SITE_OTHER): Payer: 59 | Admitting: Internal Medicine

## 2013-01-14 ENCOUNTER — Telehealth (INDEPENDENT_AMBULATORY_CARE_PROVIDER_SITE_OTHER): Payer: Self-pay

## 2013-01-14 VITALS — BP 94/56 | HR 96 | Temp 98.3°F | Resp 18 | Wt 240.0 lb

## 2013-01-14 DIAGNOSIS — I10 Essential (primary) hypertension: Secondary | ICD-10-CM

## 2013-01-14 DIAGNOSIS — G4733 Obstructive sleep apnea (adult) (pediatric): Secondary | ICD-10-CM

## 2013-01-14 NOTE — Telephone Encounter (Signed)
Patient called in asking if she can take her iron capsules and asked what OTC pain medicine she can take. After speaking with Neysa Bonito and Dr Biagio Quint I advised patient not to take capsules or tablets for 4 weeks after surgery. She can crush pill up or take liquid form. Recommend tylenol but can take ibuprofen short term for 2-3 days but no longer. Patient understands.

## 2013-01-14 NOTE — Patient Instructions (Addendum)
See me in 4-6 weeks

## 2013-01-14 NOTE — Progress Notes (Signed)
Subjective:    Patient ID: Robin Cox, female    DOB: May 31, 1970, 42 y.o.   MRN: [id number]  HPI Robin Cox is here for follow up.  She is S/P sleeve gastrectomy.  She has lost 27 lbs since her surgery on 8/5  See BP  She had one episode of dizziness while walking at [facility].  She is on  Benicar,  HCTZ and supplemental K  No syncope no palpitaitons or chest pressure  She tells me she is using all her vitamins but is having trouble with the FE  Allergies  Allergen Reactions  . Augmentin [Amoxicillin-Pot Clavulanate] Nausea And Vomiting  . Azithromycin Nausea And Vomiting    Got extremely hot    Past Medical History  Diagnosis Date  . Hypertension   . Allergy   . Sleep apnea   . Morbid obesity   . Shortness of breath oct 2013    pt given inhaler for use as needed for wheezing and sob-that has helped - no diagnosis of asthma or respiratory problems  . Headache(784.0)     h/a's prior to starting b/p medication - no problems since  . Anemia     pt on iron pills   Past Surgical History  Procedure Laterality Date  . Dilation and curettage of uterus  1990  . Laparoscopic gastric sleeve resection N/A 12/30/2012    Procedure: LAPAROSCOPIC GASTRIC SLEEVE RESECTION;  Surgeon: Mariella Saa, MD;  Location: [facility];  Service: General;  Laterality: N/A;  Laparoscopic Sleeve Gastrectomy   . Esophagogastroduodenoscopy N/A 12/30/2012    Procedure: ESOPHAGOGASTRODUODENOSCOPY (EGD);  Surgeon: Mariella Saa, MD;  Location: [facility];  Service: General;  Laterality: N/A;   History   Social History  . Marital Status: Single    Spouse Name: N/A    Number of Children: N/A  . Years of Education: N/A   Occupational History  . Not on file.   Social History Main Topics  . Smoking status: Never Smoker   . Smokeless tobacco: Never Used  . Alcohol Use: Yes     Comment: Rare  . Drug Use: No  . Sexual Activity: No   Other Topics Concern  . Not on file   Social History Narrative  .  No narrative on file   Family History  Problem Relation Age of Onset  . Hypertension Brother   . Allergies Brother   . Pancreatic cancer Maternal Grandmother     pancreatic  . Allergies Sister   . Hypertension Sister   . Breast cancer Maternal Aunt   . Allergies Sister   . Stroke Brother    Patient Active Problem List   Diagnosis Date Noted  . Anemia 12/08/2012  . Preop cardiovascular exam 10/27/2012  . Dyspnea 09/23/2012  . Cellulitis of leg, left 09/23/2012  . Essential hypertension, benign 06/16/2012  . Arrhythmia, sinus node 06/16/2012  . Morbid obesity 06/16/2012  . OSA (obstructive sleep apnea) 06/16/2012   Current Outpatient Prescriptions on File Prior to Visit  Medication Sig Dispense Refill  . albuterol (PROVENTIL HFA;VENTOLIN HFA) 108 (90 BASE) MCG/ACT inhaler Inhale 2 puffs into the lungs every 6 (six) hours as needed for wheezing.      . hydrochlorothiazide (HYDRODIURIL) 25 MG tablet Take 25 mg by mouth every evening.      . loratadine (CLARITIN) 10 MG tablet Take 10 mg by mouth every evening.      . olmesartan (BENICAR) 20 MG tablet Take 20 mg by mouth every evening.      Marland Kitchen  potassium chloride (K-DUR,KLOR-CON) 10 MEQ tablet Take 10 mEq by mouth every evening.      . Fe Fum-FePoly-Vit C-Vit B3 (INTEGRA) 62.5-62.5-40-3 MG CAPS Take by mouth. Every am       No current facility-administered medications on file prior to visit.       Review of Systems    see HPI Objective:   Physical Exam Physical Exam  Nursing note and vitals reviewed.   Repeat BP  88/60 Constitutional: She is oriented to person, place, and time. She appears well-developed and well-nourished.  HENT:  Head: Normocephalic and atraumatic.  Cardiovascular: Normal rate and regular rhythm. Exam reveals no gallop and no friction rub.  No murmur heard.  Pulmonary/Chest: Breath sounds normal. She has no wheezes. She has no rales.  Neurological: She is alert and oriented to person, place, and time.   Skin: Skin is warm and dry.  Psychiatric: She has a normal mood and affect. Her behavior is normal.             Assessment & Plan:  HTN  Markedly improved will stop BEnicar.  She is to see me in 4 weeks and if still low,  Will consider stopping all antihypertensives.    Obesity s/P gastric sleeve  Doing well advised to call surgeons office regarding FE the bariatric center would like her to use  OSA on cpap now  See me in 4-6 weeks

## 2013-01-16 ENCOUNTER — Ambulatory Visit (INDEPENDENT_AMBULATORY_CARE_PROVIDER_SITE_OTHER): Payer: 59 | Admitting: General Surgery

## 2013-01-16 ENCOUNTER — Encounter (INDEPENDENT_AMBULATORY_CARE_PROVIDER_SITE_OTHER): Payer: Self-pay | Admitting: General Surgery

## 2013-01-16 NOTE — Progress Notes (Signed)
History: Patient returns for her first postoperative visit approaching 3 weeks following sleeve gastrectomy for morbid obesity. She's been getting along quite well. No pain and she has not required any pain medication. She is transitioning to solid food. She gets some occasional nausea with eating and sometimes with her stomach emptied but no vomiting or retching. No fever. No wound problems.  Exam: BP 132/84  Pulse 74  Temp(Src) 97.4 F (36.3 C) (Temporal)  Resp 16  Ht 5' 1.5" (1.562 m)  Wt 236 lb 6.4 oz (107.23 kg)  BMI 43.95 kg/m2  LMP 01/12/2013 Weight loss 26 pounds since surgery General: Appears well Abdomen: Soft and nontender. Wounds well healed.  Assessment plan: Doing well upon sleeve gastrectomy without palpitation identified. We reviewed diet and exercise strategies. She will return in approximately one month.

## 2013-01-22 ENCOUNTER — Telehealth (INDEPENDENT_AMBULATORY_CARE_PROVIDER_SITE_OTHER): Payer: Self-pay | Admitting: *Deleted

## 2013-01-22 ENCOUNTER — Telehealth (INDEPENDENT_AMBULATORY_CARE_PROVIDER_SITE_OTHER): Payer: Self-pay | Admitting: General Surgery

## 2013-01-22 ENCOUNTER — Other Ambulatory Visit (INDEPENDENT_AMBULATORY_CARE_PROVIDER_SITE_OTHER): Payer: Self-pay | Admitting: General Surgery

## 2013-01-22 DIAGNOSIS — R112 Nausea with vomiting, unspecified: Secondary | ICD-10-CM

## 2013-01-22 LAB — CBC
HCT: 35.8 % — ABNORMAL LOW (ref 36.0–46.0)
Hemoglobin: 11.9 g/dL — ABNORMAL LOW (ref 12.0–15.0)
MCH: 26.2 pg (ref 26.0–34.0)
MCHC: 33.2 g/dL (ref 30.0–36.0)
MCV: 78.7 fL (ref 78.0–100.0)
RDW: 17.3 % — ABNORMAL HIGH (ref 11.5–15.5)

## 2013-01-22 NOTE — Telephone Encounter (Signed)
Patient called to report that she has continued to have issues with nausea since about 2 weeks after surgery.  She denies any vomiting.  Patient states that this morning while on the commode she began feeling very hot, her mouth watered, and she was gagging.  Patient states she has been trying to eat but it is very hard.  Patient reports yesterday she ate 2 slices of chz, a small amt of crab meat and then 2 oz of diluted protein shake.  Explained to patient that I would speak with Dr. Johna Sheriff and try to determine the best plan for her then give her a call back.  Patient states understanding and agreeable at this time.

## 2013-01-22 NOTE — Telephone Encounter (Signed)
Called Robin Cox and told her to go and get lab work done on 01-22-2013, due to patient having gagging and feeling nausea and can not get much food and protein intake . Spoke with Dr Johna Sheriff and he wanted the patient to get labs done and come in tomorrow in the urgent office to be checked out. And he wants patient to stay on a protein diet until she comes in on 01-23-13 to see Dr Johna Sheriff

## 2013-01-23 ENCOUNTER — Encounter (INDEPENDENT_AMBULATORY_CARE_PROVIDER_SITE_OTHER): Payer: Self-pay | Admitting: General Surgery

## 2013-01-23 ENCOUNTER — Ambulatory Visit (INDEPENDENT_AMBULATORY_CARE_PROVIDER_SITE_OTHER): Payer: 59 | Admitting: General Surgery

## 2013-01-23 VITALS — BP 140/90 | HR 104 | Temp 97.9°F | Resp 15 | Ht 61.5 in | Wt 231.2 lb

## 2013-01-23 DIAGNOSIS — Z09 Encounter for follow-up examination after completed treatment for conditions other than malignant neoplasm: Secondary | ICD-10-CM

## 2013-01-23 LAB — BASIC METABOLIC PANEL
CO2: 27 mEq/L (ref 19–32)
Chloride: 92 mEq/L — ABNORMAL LOW (ref 96–112)
Glucose, Bld: 81 mg/dL (ref 70–99)
Potassium: 3.8 mEq/L (ref 3.5–5.3)
Sodium: 134 mEq/L — ABNORMAL LOW (ref 135–145)

## 2013-01-23 MED ORDER — ONDANSETRON HCL 4 MG PO TABS: 4.0000 mg | ORAL_TABLET | Freq: Three times a day (TID) | ORAL | Status: DC | PRN

## 2013-01-23 NOTE — Progress Notes (Signed)
History: Patient returns to the office early due to some persistent nausea. She is status post sleeve gastrectomy August 5. She states that for about the past week she has had persistent nausea. This comes and goes. She will often wake up with her the morning. Certain foods seem to cause more nausea including her protein shakes and others she tolerates okay. She has not had any vomiting. No fever or chills. She denies abdominal pain.  Exam: BP 140/90  Pulse 104  Temp(Src) 97.9 F (36.6 C) (Temporal)  Resp 15  Ht 5' 1.5" (1.562 m)  Wt 231 lb 3.2 oz (104.872 kg)  BMI 42.98 kg/m2  LMP 01/12/2013 Total weight loss 31 pounds, 5 pounds since last visit General: Appears well in no distress Abdomen: Soft and nontender. Wounds are well healed.  Lab Results  Component Value Date   WBC 4.2 01/22/2013   HGB 11.9* 01/22/2013   HCT 35.8* 01/22/2013   MCV 78.7 01/22/2013   PLT 298 01/22/2013   Lab Results  Component Value Date   CREATININE 1.06 01/22/2013   BUN 8 01/22/2013   NA 134* 01/22/2013   K 3.8 01/22/2013   CL 92* 01/22/2013   CO2 27 01/22/2013   Assessment and plan: Persistent nausea following sleeve gastrectomy. I do not see any evidence of significant complication such as leakage she is afebrile with no pain no tenderness and normal white count. She does not appear to be dehydrated. I recommend at this point symptomatic management to close followup. She will try to avoid more solid foods if things that cause nausea. We will give her a prescription for Zofran to use as needed. She'll call for any worsening symptoms at all touch base with her again next week. She has an appointment to see me in followup in one month.

## 2013-02-06 ENCOUNTER — Telehealth (INDEPENDENT_AMBULATORY_CARE_PROVIDER_SITE_OTHER): Payer: Self-pay | Admitting: General Surgery

## 2013-02-06 NOTE — Telephone Encounter (Signed)
Patient called and wanted to know if she can take anything OTC for heartburn, she stated that she feels that her food feels struck in her throat. Her breathing is ok. So I called and asked Neysa Bonito to see if she could take anything OTC for heartburn and she stated yes and that she also needs to rise her head up when she lays down at night to go to sleep.

## 2013-02-09 ENCOUNTER — Encounter: Payer: Self-pay | Admitting: Internal Medicine

## 2013-02-09 ENCOUNTER — Ambulatory Visit (INDEPENDENT_AMBULATORY_CARE_PROVIDER_SITE_OTHER): Payer: 59 | Admitting: Internal Medicine

## 2013-02-09 VITALS — BP 110/70 | HR 66 | Temp 97.1°F | Resp 18 | Wt 227.0 lb

## 2013-02-09 DIAGNOSIS — I10 Essential (primary) hypertension: Secondary | ICD-10-CM

## 2013-02-09 DIAGNOSIS — D649 Anemia, unspecified: Secondary | ICD-10-CM

## 2013-02-09 DIAGNOSIS — R110 Nausea: Secondary | ICD-10-CM | POA: Insufficient documentation

## 2013-02-09 NOTE — Progress Notes (Signed)
Subjective:    Patient ID: Robin Cox, female    DOB: 1971-04-20, 42 y.o.   MRN: [id number]  HPI  Robin Cox is here for follow up of HTN.  She has lost approx 40 lbs since her gastric sleeve surgery  She has been having some nausea for the past several weeks and has not been able to take her BP meds.  She has not had any antihypertensive meds in 3 weeks.  She take occasional Zofran for her nausea and it has improved over the last few days.    Allergies  Allergen Reactions  . Augmentin [Amoxicillin-Pot Clavulanate] Nausea And Vomiting  . Azithromycin Nausea And Vomiting    Got extremely hot    Past Medical History  Diagnosis Date  . Hypertension   . Allergy   . Sleep apnea   . Morbid obesity   . Shortness of breath oct 2013    pt given inhaler for use as needed for wheezing and sob-that has helped - no diagnosis of asthma or respiratory problems  . Headache(784.0)     h/a's prior to starting b/p medication - no problems since  . Anemia     pt on iron pills   Past Surgical History  Procedure Laterality Date  . Dilation and curettage of uterus  1990  . Laparoscopic gastric sleeve resection N/A 12/30/2012    Procedure: LAPAROSCOPIC GASTRIC SLEEVE RESECTION;  Surgeon: Mariella Saa, MD;  Location: [facility];  Service: General;  Laterality: N/A;  Laparoscopic Sleeve Gastrectomy   . Esophagogastroduodenoscopy N/A 12/30/2012    Procedure: ESOPHAGOGASTRODUODENOSCOPY (EGD);  Surgeon: Mariella Saa, MD;  Location: [facility];  Service: General;  Laterality: N/A;   History   Social History  . Marital Status: Single    Spouse Name: N/A    Number of Children: N/A  . Years of Education: N/A   Occupational History  . Not on file.   Social History Main Topics  . Smoking status: Never Smoker   . Smokeless tobacco: Never Used  . Alcohol Use: Yes     Comment: Rare  . Drug Use: No  . Sexual Activity: No   Other Topics Concern  . Not on file   Social History Narrative  .  No narrative on file   Family History  Problem Relation Age of Onset  . Hypertension Brother   . Allergies Brother   . Pancreatic cancer Maternal Grandmother     pancreatic  . Allergies Sister   . Hypertension Sister   . Breast cancer Maternal Aunt   . Allergies Sister   . Stroke Brother    Patient Active Problem List   Diagnosis Date Noted  . Anemia 12/08/2012  . Preop cardiovascular exam 10/27/2012  . Dyspnea 09/23/2012  . Cellulitis of leg, left 09/23/2012  . Essential hypertension, benign 06/16/2012  . Arrhythmia, sinus node 06/16/2012  . Morbid obesity 06/16/2012  . OSA (obstructive sleep apnea) 06/16/2012   Current Outpatient Prescriptions on File Prior to Visit  Medication Sig Dispense Refill  . albuterol (PROVENTIL HFA;VENTOLIN HFA) 108 (90 BASE) MCG/ACT inhaler Inhale 2 puffs into the lungs every 6 (six) hours as needed for wheezing.      . Fe Fum-FePoly-Vit C-Vit B3 (INTEGRA) 62.5-62.5-40-3 MG CAPS Take by mouth. Every am      . hydrochlorothiazide (HYDRODIURIL) 25 MG tablet Take 25 mg by mouth every evening.      . loratadine (CLARITIN) 10 MG tablet Take 10 mg by mouth every evening.      Marland Kitchen  ondansetron (ZOFRAN) 4 MG tablet Take 1 tablet (4 mg total) by mouth every 8 (eight) hours as needed for nausea.  20 tablet  0  . potassium chloride (K-DUR,KLOR-CON) 10 MEQ tablet Take 10 mEq by mouth every evening.       No current facility-administered medications on file prior to visit.      Review of Systems    see HPI Objective:   Physical Exam  Physical Exam  Nursing note and vitals reviewed.  Constitutional: She is oriented to person, place, and time. She appears well-developed and well-nourished.  HENT:  Head: Normocephalic and atraumatic.  Cardiovascular: Normal rate and regular rhythm. Exam reveals no gallop and no friction rub.  No murmur heard.  Pulmonary/Chest: Breath sounds normal. She has no wheezes. She has no rales.  Neurological: She is alert and  oriented to person, place, and time.  Skin: Skin is warm and dry.  Psychiatric: She has a normal mood and affect. Her behavior is normal.        Assessment & Plan:  HTN:  I believe it has resolved with her weight loss.  OK to stop all antihypertensives for now  Nausea  On Zofran and per pt this is improving  Anemia  Improving  She is taking OTC FE supplement with her  MVI

## 2013-02-09 NOTE — Patient Instructions (Addendum)
Schedule cpe 

## 2013-02-17 ENCOUNTER — Telehealth (INDEPENDENT_AMBULATORY_CARE_PROVIDER_SITE_OTHER): Payer: Self-pay | Admitting: General Surgery

## 2013-02-17 NOTE — Telephone Encounter (Signed)
Pt called to report the nutritionist had instructed her to begin omeprazole QD, but they do not seem to be helping much.  Advised pt to increase them to BID for next 2-3 weeks, then drop back to QD.  She understands and will try this.  Pt will discuss this with MD at next ofice visit.

## 2013-02-20 ENCOUNTER — Ambulatory Visit (INDEPENDENT_AMBULATORY_CARE_PROVIDER_SITE_OTHER): Payer: 59 | Admitting: General Surgery

## 2013-02-22 ENCOUNTER — Inpatient Hospital Stay (HOSPITAL_COMMUNITY)
Admission: EM | Admit: 2013-02-22 | Discharge: 2013-02-24 | DRG: 641 | Disposition: A | Discharge status: Home / Self Care | Payer: 59 | Attending: Internal Medicine | Admitting: Internal Medicine

## 2013-02-22 ENCOUNTER — Emergency Department (HOSPITAL_COMMUNITY): Payer: 59

## 2013-02-22 ENCOUNTER — Encounter (HOSPITAL_COMMUNITY): Payer: Self-pay

## 2013-02-22 DIAGNOSIS — R1012 Left upper quadrant pain: Secondary | ICD-10-CM | POA: Diagnosis present

## 2013-02-22 DIAGNOSIS — D649 Anemia, unspecified: Secondary | ICD-10-CM | POA: Diagnosis present

## 2013-02-22 DIAGNOSIS — R112 Nausea with vomiting, unspecified: Secondary | ICD-10-CM | POA: Diagnosis present

## 2013-02-22 DIAGNOSIS — R319 Hematuria, unspecified: Secondary | ICD-10-CM | POA: Diagnosis present

## 2013-02-22 DIAGNOSIS — E876 Hypokalemia: Principal | ICD-10-CM | POA: Diagnosis present

## 2013-02-22 DIAGNOSIS — E872 Acidosis, unspecified: Secondary | ICD-10-CM | POA: Diagnosis present

## 2013-02-22 DIAGNOSIS — Z9884 Bariatric surgery status: Secondary | ICD-10-CM

## 2013-02-22 DIAGNOSIS — E860 Dehydration: Secondary | ICD-10-CM | POA: Diagnosis present

## 2013-02-22 LAB — HEPATIC FUNCTION PANEL
ALT: 28 U/L (ref 0–35)
Alkaline Phosphatase: 42 U/L (ref 39–117)
Bilirubin, Direct: <0.1 mg/dL (ref 0.0–0.3)
Total Protein: 7.6 g/dL (ref 6.0–8.3)

## 2013-02-22 LAB — CBC WITH DIFFERENTIAL/PLATELET
Basophils Relative: 0 % (ref 0–1)
Eosinophils Absolute: 0 10*3/uL (ref 0.0–0.7)
Eosinophils Relative: 1 % (ref 0–5)
Lymphs Abs: 1.8 10*3/uL (ref 0.7–4.0)
MCH: 27.5 pg (ref 26.0–34.0)
MCHC: 33 g/dL (ref 30.0–36.0)
MCV: 83.3 fL (ref 78.0–100.0)
Monocytes Relative: 8 % (ref 3–12)
Neutrophils Relative %: 64 % (ref 43–77)
Platelets: 293 10*3/uL (ref 150–400)
RBC: 4.07 MIL/uL (ref 3.87–5.11)

## 2013-02-22 LAB — BASIC METABOLIC PANEL
BUN: 7 mg/dL (ref 6–23)
CO2: 24 mEq/L (ref 19–32)
Calcium: 9.3 mg/dL (ref 8.4–10.5)
Creatinine, Ser: 0.83 mg/dL (ref 0.50–1.10)
GFR calc Af Amer: >90 mL/min (ref >90)
Potassium: 2.7 mEq/L — CL (ref 3.5–5.1)
Sodium: 135 mEq/L (ref 135–145)

## 2013-02-22 LAB — POCT I-STAT, CHEM 8
BUN: 4 mg/dL — ABNORMAL LOW (ref 6–23)
Calcium, Ion: 1.13 mmol/L (ref 1.12–1.23)
Chloride: 98 mEq/L (ref 96–112)
Creatinine, Ser: 0.9 mg/dL (ref 0.50–1.10)
Glucose, Bld: 93 mg/dL (ref 70–99)
HCT: 36 % (ref 36.0–46.0)
Sodium: 139 mEq/L (ref 135–145)
TCO2: 26 mmol/L (ref 0–100)

## 2013-02-22 LAB — POCT PREGNANCY, URINE: Preg Test, Ur: NEGATIVE

## 2013-02-22 LAB — MAGNESIUM: Magnesium: 1.6 mg/dL (ref 1.5–2.5)

## 2013-02-22 LAB — URINALYSIS, ROUTINE W REFLEX MICROSCOPIC
Bilirubin Urine: NEGATIVE
Ketones, ur: >80 mg/dL — AB
Leukocytes, UA: NEGATIVE
Nitrite: NEGATIVE
Urobilinogen, UA: 0.2 mg/dL (ref 0.0–1.0)

## 2013-02-22 LAB — URINE MICROSCOPIC-ADD ON

## 2013-02-22 LAB — LACTIC ACID, PLASMA: Lactic Acid, Venous: 1.2 mmol/L (ref 0.5–2.2)

## 2013-02-22 LAB — LIPASE, BLOOD: Lipase: 73 U/L — ABNORMAL HIGH (ref 11–59)

## 2013-02-22 MED ORDER — IOHEXOL 300 MG/ML  SOLN
100.0000 mL | Freq: Once | INTRAMUSCULAR | Status: AC | PRN
  Administered 2013-02-22: 100 mL via INTRAVENOUS

## 2013-02-22 MED ORDER — MORPHINE SULFATE 4 MG/ML IJ SOLN
4.0000 mg | Freq: Once | INTRAMUSCULAR | Status: AC
  Administered 2013-02-22: 4 mg via INTRAVENOUS
  Filled 2013-02-22: qty 1

## 2013-02-22 MED ORDER — POLYETHYLENE GLYCOL 3350 17 G PO PACK
17.0000 g | PACK | Freq: Every day | ORAL | Status: DC | PRN
  Filled 2013-02-22: qty 1

## 2013-02-22 MED ORDER — SODIUM CHLORIDE 0.9 % IV BOLUS (SEPSIS)
1000.0000 mL | Freq: Once | INTRAVENOUS | Status: AC
  Administered 2013-02-22: 1000 mL via INTRAVENOUS

## 2013-02-22 MED ORDER — HYDROMORPHONE HCL PF 1 MG/ML IJ SOLN: 1.0000 mg | INTRAMUSCULAR | Status: DC | PRN

## 2013-02-22 MED ORDER — IOHEXOL 300 MG/ML  SOLN
50.0000 mL | Freq: Once | INTRAMUSCULAR | Status: AC | PRN
  Administered 2013-02-22: 50 mL via ORAL

## 2013-02-22 MED ORDER — ONDANSETRON HCL 4 MG PO TABS: 4.0000 mg | ORAL_TABLET | Freq: Four times a day (QID) | ORAL | Status: DC | PRN

## 2013-02-22 MED ORDER — POTASSIUM CHLORIDE CRYS ER 20 MEQ PO TBCR
20.0000 meq | EXTENDED_RELEASE_TABLET | Freq: Once | ORAL | Status: AC
  Administered 2013-02-22: 20 meq via ORAL
  Filled 2013-02-22 (×2): qty 1

## 2013-02-22 MED ORDER — SODIUM CHLORIDE 0.9 % IV SOLN
INTRAVENOUS | Status: AC
  Administered 2013-02-22 – 2013-02-23 (×2): via INTRAVENOUS

## 2013-02-22 MED ORDER — ONDANSETRON HCL 4 MG/2ML IJ SOLN
4.0000 mg | Freq: Once | INTRAMUSCULAR | Status: AC
  Administered 2013-02-22: 4 mg via INTRAVENOUS
  Filled 2013-02-22: qty 2

## 2013-02-22 MED ORDER — SODIUM CHLORIDE 0.9 % IJ SOLN
3.0000 mL | Freq: Two times a day (BID) | INTRAMUSCULAR | Status: DC
  Administered 2013-02-22 – 2013-02-24 (×2): 3 mL via INTRAVENOUS

## 2013-02-22 MED ORDER — PANTOPRAZOLE SODIUM 40 MG PO TBEC
40.0000 mg | DELAYED_RELEASE_TABLET | Freq: Every day | ORAL | Status: DC
  Administered 2013-02-23 – 2013-02-24 (×2): 40 mg via ORAL
  Filled 2013-02-22 (×2): qty 1

## 2013-02-22 MED ORDER — ONDANSETRON HCL 4 MG/2ML IJ SOLN
4.0000 mg | Freq: Four times a day (QID) | INTRAMUSCULAR | Status: DC | PRN
  Administered 2013-02-23: 4 mg via INTRAVENOUS
  Filled 2013-02-22: qty 2

## 2013-02-22 MED ORDER — POTASSIUM CHLORIDE 10 MEQ/100ML IV SOLN
10.0000 meq | INTRAVENOUS | Status: AC
  Administered 2013-02-22 – 2013-02-23 (×4): 10 meq via INTRAVENOUS
  Filled 2013-02-22 (×5): qty 100

## 2013-02-22 MED ORDER — HYDROMORPHONE HCL PF 1 MG/ML IJ SOLN
0.5000 mg | INTRAMUSCULAR | Status: DC | PRN
Start: 2013-02-22 — End: 2013-02-24
  Administered 2013-02-22 – 2013-02-23 (×2): 0.5 mg via INTRAVENOUS
  Filled 2013-02-22 (×2): qty 1

## 2013-02-22 MED ORDER — ACETAMINOPHEN 650 MG RE SUPP: 650.0000 mg | Freq: Four times a day (QID) | RECTAL | Status: DC | PRN

## 2013-02-22 MED ORDER — ONDANSETRON HCL 4 MG/2ML IJ SOLN: 4.0000 mg | Freq: Three times a day (TID) | INTRAMUSCULAR | Status: DC | PRN

## 2013-02-22 MED ORDER — ALBUTEROL SULFATE HFA 108 (90 BASE) MCG/ACT IN AERS
2.0000 | INHALATION_SPRAY | Freq: Four times a day (QID) | RESPIRATORY_TRACT | Status: DC | PRN
  Filled 2013-02-22: qty 6.7

## 2013-02-22 MED ORDER — POTASSIUM CHLORIDE CRYS ER 10 MEQ PO TBCR
10.0000 meq | EXTENDED_RELEASE_TABLET | Freq: Every evening | ORAL | Status: DC
  Administered 2013-02-23: 10 meq via ORAL
  Filled 2013-02-22 (×2): qty 1

## 2013-02-22 MED ORDER — POTASSIUM CHLORIDE 10 MEQ/100ML IV SOLN
10.0000 meq | Freq: Once | INTRAVENOUS | Status: AC
  Administered 2013-02-22: 10 meq via INTRAVENOUS
  Filled 2013-02-22: qty 100

## 2013-02-22 MED ORDER — POTASSIUM CHLORIDE CRYS ER 20 MEQ PO TBCR
40.0000 meq | EXTENDED_RELEASE_TABLET | Freq: Once | ORAL | Status: AC
  Administered 2013-02-22: 40 meq via ORAL
  Filled 2013-02-22: qty 2

## 2013-02-22 MED ORDER — ACETAMINOPHEN 325 MG PO TABS: 650.0000 mg | ORAL_TABLET | Freq: Four times a day (QID) | ORAL | Status: DC | PRN

## 2013-02-22 NOTE — H&P (Addendum)
[facility] History and Physical  Robin Cox ZOX:[id number] DOB: 07/09/1970 DOA: 02/22/2013  Referring physician: ER physician. PCP: Levon Hedger, MD  Specialists: Dr. Johna Sheriff. Surgeon.  Chief Complaint: Left upper quadrant pain with nausea and unable to eat.  HPI: Robin Cox is a 42 y.o. female who has had a recent gastric bypass surgery in August this year presented to the ER because of left upper quadrant pain with nausea and unable to take anything since morning. In the ER patient was found to be hypokalemic with mild metabolic acidosis. CT abdomen and pelvis was done which did not show anything acute. This time patient has been admitted for observation. On my exam patient states after she had received pain relief medications her left upper quadrant pain has improved. The pain was stabbing in nature radiating around the left flank area. Denies any diarrhea fever chills chest pain shortness of breath. Patient states that after the surgery she did have some nausea which improved.  Review of Systems: As presented in the history of presenting illness, rest negative.  Past Medical History  Diagnosis Date  . Hypertension   . Allergy   . Sleep apnea   . Morbid obesity   . Shortness of breath oct 2013    pt given inhaler for use as needed for wheezing and sob-that has helped - no diagnosis of asthma or respiratory problems  . Headache(784.0)     h/a's prior to starting b/p medication - no problems since  . Anemia     pt on iron pills   Past Surgical History  Procedure Laterality Date  . Dilation and curettage of uterus  1990  . Laparoscopic gastric sleeve resection N/A 12/30/2012    Procedure: LAPAROSCOPIC GASTRIC SLEEVE RESECTION;  Surgeon: Mariella Saa, MD;  Location: [facility];  Service: General;  Laterality: N/A;  Laparoscopic Sleeve Gastrectomy   . Esophagogastroduodenoscopy N/A 12/30/2012    Procedure: ESOPHAGOGASTRODUODENOSCOPY (EGD);  Surgeon: Mariella Saa, MD;  Location: [facility];  Service: General;  Laterality: N/A;   Social History:  reports that she has never smoked. She has never used smokeless tobacco. She reports that  drinks alcohol. She reports that she does not use illicit drugs. Home. where does patient live-- Can do ADLs. Can patient participate in ADLs?  Allergies  Allergen Reactions  . Augmentin [Amoxicillin-Pot Clavulanate] Nausea And Vomiting  . Azithromycin Nausea And Vomiting    Got extremely hot     Family History  Problem Relation Age of Onset  . Hypertension Brother   . Allergies Brother   . Pancreatic cancer Maternal Grandmother     pancreatic  . Allergies Sister   . Hypertension Sister   . Breast cancer Maternal Aunt   . Allergies Sister   . Stroke Brother       Prior to Admission medications   Medication Sig Start Date End Date Taking? Authorizing Provider  albuterol (PROVENTIL HFA;VENTOLIN HFA) 108 (90 BASE) MCG/ACT inhaler Inhale 2 puffs into the lungs every 6 (six) hours as needed for wheezing.   Yes Historical Provider, MD  omeprazole (PRILOSEC) 20 MG capsule Take 20 mg by mouth daily.   Yes Historical Provider, MD  polyethylene glycol (MIRALAX / GLYCOLAX) packet Take 17 g by mouth daily as needed (for constipation).   Yes Historical Provider, MD  Fe Fum-FePoly-Vit C-Vit B3 (INTEGRA) 62.5-62.5-40-3 MG CAPS Take 1 capsule by mouth daily.     Historical Provider, MD  potassium chloride (K-DUR,KLOR-CON) 10 MEQ tablet Take  10 mEq by mouth every evening.    Historical Provider, MD   Physical Exam: Filed Vitals:   02/22/13 1408 02/22/13 1701  BP: 163/102 110/65  Pulse: 78 56  Temp: 99.1 F (37.3 C) 97.9 F (36.6 C)  TempSrc:  Oral  Resp: 18 11  SpO2: 98% 100%     General:  Well-developed well-nourished.  Eyes: Anicteric no pallor.  ENT: No discharge from ears eyes nose mouth.  Neck: No mass felt.  Cardiovascular: S1-S2 heard.  Respiratory: No rhonchi or crepitations.  Abdomen:  Soft nontender bowel sounds present. No guarding or rigidity.  Skin: No rash.  Musculoskeletal: No edema.  Psychiatric: Appears normal.  Neurologic: Alert awake oriented to time place and person. Moves all extremities.  Labs on Admission:  Basic Metabolic Panel:  Recent Labs Lab 02/22/13 1541 02/22/13 1628  NA 139 135  K 2.8* 2.7*  CL 98 94*  CO2  --  24  GLUCOSE 93 91  BUN 4* 7  CREATININE 0.90 0.83  CALCIUM  --  9.3   Liver Function Tests:  Recent Labs Lab 02/22/13 1534  AST 28  ALT 28  ALKPHOS 42  BILITOT 0.2*  PROT 7.6  ALBUMIN 3.5    Recent Labs Lab 02/22/13 1534  LIPASE 73*   No results found for this basename: AMMONIA,  in the last 168 hours CBC:  Recent Labs Lab 02/22/13 1534 02/22/13 1541  WBC 6.6  --   NEUTROABS 4.2  --   HGB 11.2* 12.2  HCT 33.9* 36.0  MCV 83.3  --   PLT 293  --    Cardiac Enzymes: No results found for this basename: CKTOTAL, CKMB, CKMBINDEX, TROPONINI,  in the last 168 hours  BNP (last 3 results)  Recent Labs  09/13/12 1110  PROBNP 10.0   CBG: No results found for this basename: GLUCAP,  in the last 168 hours  Radiological Exams on Admission: Ct Abdomen Pelvis W Contrast  02/22/2013   CLINICAL DATA:  Mid abdominal pain, nausea, history of gastric sleeve surgery in August 2014, hypertension, initial encounter  EXAM: CT ABDOMEN AND PELVIS WITH CONTRAST  TECHNIQUE: Multidetector CT imaging of the abdomen and pelvis was performed using the standard protocol following bolus administration of intravenous contrast. Sagittal and coronal MPR images reconstructed from axial data set.  CONTRAST:  OMNIPAQUE IOHEXOL 300 MG/ML SOLN Dilute oral contrast.  COMPARISON:  None  FINDINGS: Lung bases clear.  Mild focal fatty infiltration of liver adjacent to falciform fissure.  Liver, spleen, pancreas, kidneys, and adrenal glands normal appearance.  Splenule anterior to spleen.  Normal appendix, retrocecal in position.  Probable  leiomyoma at the anterior aspect of the mid uterine segment, 6.4 x 5.8 x 3.5 cm in size.  Otherwise unremarkable bladder, ureters, uterus, and ovaries.  Prior gastric surgery.  Bowel loops normal appearance.  No mass, adenopathy, free fluid, or inflammatory process.  No hernia or acute bone lesion.  Few scattered normal size pelvic and retroperitoneal lymph nodes identified.  IMPRESSION: Prior gastric surgery.  Probable large uterine leiomyoma 6.4 x 5.8 x 3.5 cm.  No acute intra-abdominal or intrapelvic abnormalities.   Electronically Signed   By: Ulyses Southward M.D.   On: 02/22/2013 18:21   Dg Abd Acute W/chest  02/22/2013   CLINICAL DATA:  Abdominal pain. Distention.  EXAM: ACUTE ABDOMEN SERIES (ABDOMEN 2 VIEW & CHEST 1 VIEW)  COMPARISON:  10/09/2012.  FINDINGS: No active cardiopulmonary disease or free air underneath the hemidiaphragms. Gaseous  distension of small bowel loops is present with scattered air-fluid levels. There is no bowel dilation. Paucity of distal rectal gas. Radiopaque densities are present in the right lower quadrant near the cecum, likely representing contents in the enteric stream. Appendicolith is considered unlikely based on the configuration.  IMPRESSION: Nonobstructive bowel gas pattern with scattered air-fluid levels. Although nonspecific, this is most commonly associated with enteric infection.   Electronically Signed   By: Andreas Newport M.D.   On: 02/22/2013 15:59     Assessment/Plan Principal Problem:   Dehydration Active Problems:   Hypokalemia   Abdominal pain, left upper quadrant   1. Abdominal pain with nausea and poor oral intake - cause is not clear. At this time I have placed patient on diet to be advanced as tolerated. Continue gentle hydration. If the pain does not improve may consult patient's surgeon Dr. Johna Sheriff. 2. Hypokalemia and dehydration probably from poor oral intake - replace and recheck. Check magnesium levels. 3. Anemia - patient states that she  has not been taking her iron pills due to nausea. Patient states she has an appointment with nutritionist. 4. History of hypertension - presently off HCTZ for last one month as patient states her blood pressure improved after she lost weight.    Code Status: Full code.  Family Communication: None.  Disposition Plan: Admit for observation.    Renleigh Ouellet N. [facility] Pager [phone].  If 7PM-7AM, please contact night-coverage [email or web address] Password [facility] 02/22/2013, 10:18 PM

## 2013-02-22 NOTE — ED Notes (Signed)
I stat critical level  - low K - PA Bowie aware.

## 2013-02-22 NOTE — ED Notes (Signed)
Patient reports that she is having a sharp, pressure pain in her left abdomen and left flank. Patient states she has felt nauseated twice, but has not vomited. Patient denies dysuria, frequency or diarrhea.

## 2013-02-22 NOTE — ED Provider Notes (Signed)
CSN: [id number]     Arrival date & time 02/22/13  1402 History   First MD Initiated Contact with Patient 02/22/13 1503     Chief Complaint  Patient presents with  . Flank Pain  . Abdominal Pain   (Consider location/radiation/quality/duration/timing/severity/associated sxs/prior Treatment) HPI  42 year old morbidly obese female with history of shortness of breath, and anemia and prior surgical history of laparoscopic gastric sleeve resection presents for evaluations of abdominal pain. Patient reports acute onset of sharp stabbing pain to the right upper quadrant abdomen radiates to her back which wakes up this morning. Pain improves when she walks around and worse when she lays flat. She endorsed nausea and had vomited twice. Vomitus is nonbloody, non-bilious. Pain is currently 8/10. Reports subjective fever. No specific treatment tried. She otherwise denies headache, chest pain, shortness of breath, productive cough, hemoptysis, dyspnea on exertion, dysuria, hematuria, hematochezia, or melena. She is able to pass flatus. Her last bowel movement was 5 days ago which she states is normal for her since she had her abdominal surgery in August. Patient does not have history of diabetes, history of alcohol abuse. No prior history of pancreatitis. Patient is a nonsmoker  Past Medical History  Diagnosis Date  . Hypertension   . Allergy   . Sleep apnea   . Morbid obesity   . Shortness of breath oct 2013    pt given inhaler for use as needed for wheezing and sob-that has helped - no diagnosis of asthma or respiratory problems  . Headache(784.0)     h/a's prior to starting b/p medication - no problems since  . Anemia     pt on iron pills   Past Surgical History  Procedure Laterality Date  . Dilation and curettage of uterus  1990  . Laparoscopic gastric sleeve resection N/A 12/30/2012    Procedure: LAPAROSCOPIC GASTRIC SLEEVE RESECTION;  Surgeon: Mariella Saa, MD;  Location: [facility];  Service:  General;  Laterality: N/A;  Laparoscopic Sleeve Gastrectomy   . Esophagogastroduodenoscopy N/A 12/30/2012    Procedure: ESOPHAGOGASTRODUODENOSCOPY (EGD);  Surgeon: Mariella Saa, MD;  Location: [facility];  Service: General;  Laterality: N/A;   Family History  Problem Relation Age of Onset  . Hypertension Brother   . Allergies Brother   . Pancreatic cancer Maternal Grandmother     pancreatic  . Allergies Sister   . Hypertension Sister   . Breast cancer Maternal Aunt   . Allergies Sister   . Stroke Brother    History  Substance Use Topics  . Smoking status: Never Smoker   . Smokeless tobacco: Never Used  . Alcohol Use: Yes     Comment: Rare   OB History   Grav Para Term Preterm Abortions TAB SAB Ect Mult Living   1         0     Review of Systems  All other systems reviewed and are negative.    Allergies  Augmentin and Azithromycin  Home Medications   Current Outpatient Rx  Name  Route  Sig  Dispense  Refill  . albuterol (PROVENTIL HFA;VENTOLIN HFA) 108 (90 BASE) MCG/ACT inhaler   Inhalation   Inhale 2 puffs into the lungs every 6 (six) hours as needed for wheezing.         Marland Kitchen omeprazole (PRILOSEC) 20 MG capsule   Oral   Take 20 mg by mouth daily.         . polyethylene glycol (MIRALAX / GLYCOLAX) packet  Oral   Take 17 g by mouth daily as needed (for constipation).         . Fe Fum-FePoly-Vit C-Vit B3 (INTEGRA) 62.5-62.5-40-3 MG CAPS   Oral   Take 1 capsule by mouth daily.          . potassium chloride (K-DUR,KLOR-CON) 10 MEQ tablet   Oral   Take 10 mEq by mouth every evening.          BP 163/102  Pulse 78  Temp(Src) 99.1 F (37.3 C)  Resp 18  SpO2 98% Physical Exam  Nursing note and vitals reviewed. Constitutional: She is oriented to person, place, and time. She appears well-developed and well-nourished. No distress.  Patient is morbidly obese, Awake, alert, nontoxic appearance  HENT:  Head: Atraumatic.  Eyes: Conjunctivae are  normal. Right eye exhibits no discharge. Left eye exhibits no discharge.  Neck: Neck supple.  Cardiovascular: Normal rate and regular rhythm.   Pulmonary/Chest: Effort normal. No respiratory distress. She exhibits no tenderness.  Abdominal: Soft. There is no tenderness (Mild tenderness to left upper quadrant without guarding or rebound tenderness. No Murphy sign, no McBurney's point.). There is no rebound.  Genitourinary:  No CVA tenderness  Musculoskeletal: She exhibits no tenderness.  ROM appears intact, no obvious focal weakness  Neurological: She is alert and oriented to person, place, and time.  Mental status and motor strength appears intact  Skin: No rash noted.  Psychiatric: She has a normal mood and affect.    ED Course  Procedures (including critical care time)   Date: 02/22/2013  Rate: 49  Rhythm: sinus brady  QRS Axis: normal  Intervals: normal  ST/T Wave abnormalities: normal  Conduction Disutrbances: none  Narrative Interpretation:   Old EKG Reviewed: No significant changes noted     3:17 PM Patient here with left upper quadrant tenderness. She has a nonsurgical abdomen. She does have prior history of abdominal surgery and has not had a bowel movement in 5 days. Will obtain acute abdominal series to rule out obstruction. Pain medication, work up initiated. Doubt cardiopulmonary etiology.  4:35 PM Acute abdominal series shows evidence of an nonspecific bowel gas pattern, and there was a statement indicates that this may be associated with enteric infection according to the radiologist. Patient otherwise without fever, or elevated white count. Care discussed with my attending. I've also consult and general surgery, Dr. Lovell Sheehan, who recommend obtaining abdominal pelvis CT scan for further evaluation. Patient agrees with plan. Patient states the symptom has improved with initial dose of pain medication.  5:13 PM K+ 2.7 even after repeat value.  No EKG changes.   Supplementation given including Potassium via PO and via IV.    7:59 PM Patient is hypokalemic. Does have an anion gap of 17, unknown source. Urine shows evidence of dehydrationwith elevated specific gravity, and > 80 ketones.  She also has bloody urine but no evidence of kidney stones on CT scan. She has evidence of dehydration. Will consult for admission for further evaluations of her metabolic acidosis. Lactic acid has been ordered.  9:04 PM I have consulted with [facility], Dr. Toniann Fail who agrees to admit pt to Team 10, tele, under his care.  [facility] does not have team 10, will place pt to team 8.  Pt agrees with plan.    Labs Review Labs Reviewed  CBC WITH DIFFERENTIAL - Abnormal; Notable for the following:    Hemoglobin 11.2 (*)    HCT 33.9 (*)  RDW 16.3 (*)    All other components within normal limits  LIPASE, BLOOD - Abnormal; Notable for the following:    Lipase 73 (*)    All other components within normal limits  HEPATIC FUNCTION PANEL - Abnormal; Notable for the following:    Total Bilirubin 0.2 (*)    All other components within normal limits  URINALYSIS, ROUTINE W REFLEX MICROSCOPIC - Abnormal; Notable for the following:    APPearance CLOUDY (*)    Specific Gravity, Urine 1.036 (*)    Hgb urine dipstick LARGE (*)    Ketones, ur >80 (*)    Protein, ur 30 (*)    All other components within normal limits  BASIC METABOLIC PANEL - Abnormal; Notable for the following:    Potassium 2.7 (*)    Chloride 94 (*)    GFR calc non Af Amer 86 (*)    All other components within normal limits  POCT I-STAT, CHEM 8 - Abnormal; Notable for the following:    Potassium 2.8 (*)    BUN 4 (*)    All other components within normal limits  URINE MICROSCOPIC-ADD ON  LACTIC ACID, PLASMA  POCT PREGNANCY, URINE   Imaging Review Ct Abdomen Pelvis W Contrast  02/22/2013   CLINICAL DATA:  Mid abdominal pain, nausea, history of gastric sleeve surgery in August 2014,  hypertension, initial encounter  EXAM: CT ABDOMEN AND PELVIS WITH CONTRAST  TECHNIQUE: Multidetector CT imaging of the abdomen and pelvis was performed using the standard protocol following bolus administration of intravenous contrast. Sagittal and coronal MPR images reconstructed from axial data set.  CONTRAST:  OMNIPAQUE IOHEXOL 300 MG/ML SOLN Dilute oral contrast.  COMPARISON:  None  FINDINGS: Lung bases clear.  Mild focal fatty infiltration of liver adjacent to falciform fissure.  Liver, spleen, pancreas, kidneys, and adrenal glands normal appearance.  Splenule anterior to spleen.  Normal appendix, retrocecal in position.  Probable leiomyoma at the anterior aspect of the mid uterine segment, 6.4 x 5.8 x 3.5 cm in size.  Otherwise unremarkable bladder, ureters, uterus, and ovaries.  Prior gastric surgery.  Bowel loops normal appearance.  No mass, adenopathy, free fluid, or inflammatory process.  No hernia or acute bone lesion.  Few scattered normal size pelvic and retroperitoneal lymph nodes identified.  IMPRESSION: Prior gastric surgery.  Probable large uterine leiomyoma 6.4 x 5.8 x 3.5 cm.  No acute intra-abdominal or intrapelvic abnormalities.   Electronically Signed   By: Ulyses Southward M.D.   On: 02/22/2013 18:21   Dg Abd Acute W/chest  02/22/2013   CLINICAL DATA:  Abdominal pain. Distention.  EXAM: ACUTE ABDOMEN SERIES (ABDOMEN 2 VIEW & CHEST 1 VIEW)  COMPARISON:  10/09/2012.  FINDINGS: No active cardiopulmonary disease or free air underneath the hemidiaphragms. Gaseous distension of small bowel loops is present with scattered air-fluid levels. There is no bowel dilation. Paucity of distal rectal gas. Radiopaque densities are present in the right lower quadrant near the cecum, likely representing contents in the enteric stream. Appendicolith is considered unlikely based on the configuration.  IMPRESSION: Nonobstructive bowel gas pattern with scattered air-fluid levels. Although nonspecific, this is  most commonly associated with enteric infection.   Electronically Signed   By: Andreas Newport M.D.   On: 02/22/2013 15:59    MDM   1. Hypokalemia   2. Abdominal pain, left upper quadrant   3. Dehydration   4. Metabolic acidosis    BP 110/65  Pulse 56  Temp(Src) 97.9 F (36.6  C) (Oral)  Resp 11  SpO2 100%  LMP 02/13/2013  I have reviewed nursing notes and vital signs. I personally reviewed the imaging tests through PACS system  I reviewed available ER/hospitalization records thought the EMR     Fayrene Helper, [place] 02/22/13 2105

## 2013-02-23 DIAGNOSIS — D649 Anemia, unspecified: Secondary | ICD-10-CM

## 2013-02-23 LAB — BASIC METABOLIC PANEL WITH GFR
BUN: 4 mg/dL — ABNORMAL LOW (ref 6–23)
CO2: 25 meq/L (ref 19–32)
Calcium: 8.2 mg/dL — ABNORMAL LOW (ref 8.4–10.5)
Chloride: 101 meq/L (ref 96–112)
Creatinine, Ser: 0.81 mg/dL (ref 0.50–1.10)
GFR calc Af Amer: >90 mL/min
GFR calc non Af Amer: 89 mL/min — ABNORMAL LOW
Glucose, Bld: 80 mg/dL (ref 70–99)
Potassium: 3.7 meq/L (ref 3.5–5.1)
Sodium: 137 meq/L (ref 135–145)

## 2013-02-23 LAB — CBC
HCT: 29.1 % — ABNORMAL LOW (ref 36.0–46.0)
Hemoglobin: 9.4 g/dL — ABNORMAL LOW (ref 12.0–15.0)
MCH: 27.2 pg (ref 26.0–34.0)
MCHC: 32.3 g/dL (ref 30.0–36.0)
MCV: 84.3 fL (ref 78.0–100.0)
Platelets: 198 10*3/uL (ref 150–400)
RBC: 3.45 MIL/uL — ABNORMAL LOW (ref 3.87–5.11)
RDW: 16.9 % — ABNORMAL HIGH (ref 11.5–15.5)
WBC: 6.1 10*3/uL (ref 4.0–10.5)

## 2013-02-23 MED ORDER — BISACODYL 5 MG PO TBEC
5.0000 mg | DELAYED_RELEASE_TABLET | Freq: Every day | ORAL | Status: DC
  Administered 2013-02-23 – 2013-02-24 (×2): 5 mg via ORAL
  Filled 2013-02-23 (×2): qty 1

## 2013-02-23 NOTE — Care Management Note (Addendum)
    Page 1 of 1   02/24/2013     11:02:59 AM   CARE MANAGEMENT NOTE 02/24/2013  Patient:  Robin Cox, Robin Cox   Account Number:  [id number]  Date Initiated:  02/23/2013  Documentation initiated by:  Lanier Clam  Subjective/Objective Assessment:   42 Y/O F ADMITTED W/DEHYDRATION.S/P GASTRIC BYPASS.     Action/Plan:   FROM HOME.   Anticipated DC Date:  02/24/2013   Anticipated DC Plan:  HOME/SELF CARE         Choice offered to / List presented to:             Status of service:  Completed, signed off Medicare Important Message given?   (If response is "NO", the following Medicare IM given date fields will be blank) Date Medicare IM given:   Date Additional Medicare IM given:    Discharge Disposition:  HOME/SELF CARE  Per UR Regulation:  Reviewed for med. necessity/level of care/duration of stay  If discussed at Long Length of Stay Meetings, dates discussed:    Comments:  02/24/13 Kyrah Schiro RN,BSN NCM [phone] D/C HOME.  02/23/13 Gearld Kerstein RN,BSN NCM [phone] NO ANTICIPATED D/C NEEDS.

## 2013-02-23 NOTE — Progress Notes (Addendum)
[facility] PROGRESS NOTE  JOURDAN MALDONADO WUJ:[id number] DOB: [birth year] DOA: 02/22/2013 PCP: Levon Hedger, MD  HPI: Robin Cox is a 42 y.o. female who has had a recent gastric bypass surgery in August this year presented to the ER because of left upper quadrant pain with nausea and unable to take anything since morning. In the ER patient was found to be hypokalemic with mild metabolic acidosis. CT abdomen and pelvis was done which did not show anything acute. This time patient has been admitted for observation. On my exam patient states after she had received pain relief medications her left upper quadrant pain has improved. The pain was stabbing in nature radiating around the left flank area. Denies any diarrhea fever chills chest pain shortness of breath. Patient states that after the surgery she did have some nausea which improved.  Assessment/Plan: 1. Abdominal pain with nausea and poor oral intake - cause is not clear, but seems to be resolving. Patient's pain is minimal today and she is starting a bariatric diet. Monitor.  2. Hypokalemia and dehydration probably from poor oral intake - replace and recheck. Check magnesium levels. 3. Anemia - patient states that she has not been taking her iron pills due to nausea. Patient states she has an appointment with nutritionist. 4. History of hypertension - off of her meds since weight loss controlled her blood pressure. 5. Anemia - some dilutional component. Check in am  Code Status: Full Family Communication: none  Disposition Plan: inpatient, home tomorrow  Consultants:  none  Procedures:  none  HPI/Subjective: Feeling a bit better  Objective: Filed Vitals:   02/22/13 1408 02/22/13 1701 02/22/13 2255 02/23/13 0545  BP: 163/102 110/65 102/61 107/64  Pulse: 78 56 76 63  Temp: 99.1 F (37.3 C) 97.9 F (36.6 C) 98.2 F (36.8 C) 98.2 F (36.8 C)  TempSrc:  Oral Oral Oral  Resp: 18 11 16 18   Height:   5\' 1"  (1.549  m)   Weight:   103.4 kg (227 lb 15.3 oz)   SpO2: 98% 100% 98% 100%    Intake/Output Summary (Last 24 hours) at 02/23/13 0840 Last data filed at 02/23/13 0600  Gross per 24 hour  Intake 1236.66 ml  Output    650 ml  Net 586.66 ml   Filed Weights   02/22/13 2255  Weight: 103.4 kg (227 lb 15.3 oz)    Exam:  General:  NAD  Cardiovascular: regular rate and rhythm, without MRG  Respiratory: good air movement, clear to auscultation throughout, no wheezing, ronchi or rales  Abdomen: soft, mild tenderness to palpation LLQ, positive bowel sounds  MSK: no peripheral edema  Neuro: non focal  Data Reviewed: Basic Metabolic Panel:  Recent Labs Lab 02/22/13 1534 02/22/13 1541 02/22/13 1628 02/23/13 0448  NA  --  139 135 137  K  --  2.8* 2.7* 3.7  CL  --  98 94* 101  CO2  --   --  24 25  GLUCOSE  --  93 91 80  BUN  --  4* 7 4*  CREATININE  --  0.90 0.83 0.81  CALCIUM  --   --  9.3 8.2*  MG 1.6  --   --   --    Liver Function Tests:  Recent Labs Lab 02/22/13 1534  AST 28  ALT 28  ALKPHOS 42  BILITOT 0.2*  PROT 7.6  ALBUMIN 3.5    Recent Labs Lab 02/22/13 1534  LIPASE 73*   CBC:  Recent Labs Lab 02/22/13 1534 02/22/13 1541 02/23/13 0448  WBC 6.6  --  6.1  NEUTROABS 4.2  --   --   HGB 11.2* 12.2 9.4*  HCT 33.9* 36.0 29.1*  MCV 83.3  --  84.3  PLT 293  --  198   BNP (last 3 results)  Recent Labs  09/13/12 1110  PROBNP 10.0   Studies: Ct Abdomen Pelvis W Contrast  02/22/2013   CLINICAL DATA:  Mid abdominal pain, nausea, history of gastric sleeve surgery in August 2014, hypertension, initial encounter  EXAM: CT ABDOMEN AND PELVIS WITH CONTRAST  TECHNIQUE: Multidetector CT imaging of the abdomen and pelvis was performed using the standard protocol following bolus administration of intravenous contrast. Sagittal and coronal MPR images reconstructed from axial data set.  CONTRAST:  OMNIPAQUE IOHEXOL 300 MG/ML SOLN Dilute oral contrast.   COMPARISON:  None  FINDINGS: Lung bases clear.  Mild focal fatty infiltration of liver adjacent to falciform fissure.  Liver, spleen, pancreas, kidneys, and adrenal glands normal appearance.  Splenule anterior to spleen.  Normal appendix, retrocecal in position.  Probable leiomyoma at the anterior aspect of the mid uterine segment, 6.4 x 5.8 x 3.5 cm in size.  Otherwise unremarkable bladder, ureters, uterus, and ovaries.  Prior gastric surgery.  Bowel loops normal appearance.  No mass, adenopathy, free fluid, or inflammatory process.  No hernia or acute bone lesion.  Few scattered normal size pelvic and retroperitoneal lymph nodes identified.  IMPRESSION: Prior gastric surgery.  Probable large uterine leiomyoma 6.4 x 5.8 x 3.5 cm.  No acute intra-abdominal or intrapelvic abnormalities.   Electronically Signed   By: Ulyses Southward M.D.   On: 02/22/2013 18:21   Dg Abd Acute W/chest  02/22/2013   CLINICAL DATA:  Abdominal pain. Distention.  EXAM: ACUTE ABDOMEN SERIES (ABDOMEN 2 VIEW & CHEST 1 VIEW)  COMPARISON:  10/09/2012.  FINDINGS: No active cardiopulmonary disease or free air underneath the hemidiaphragms. Gaseous distension of small bowel loops is present with scattered air-fluid levels. There is no bowel dilation. Paucity of distal rectal gas. Radiopaque densities are present in the right lower quadrant near the cecum, likely representing contents in the enteric stream. Appendicolith is considered unlikely based on the configuration.  IMPRESSION: Nonobstructive bowel gas pattern with scattered air-fluid levels. Although nonspecific, this is most commonly associated with enteric infection.   Electronically Signed   By: Andreas Newport M.D.   On: 02/22/2013 15:59    Scheduled Meds: . bisacodyl  5 mg Oral Daily  . pantoprazole  40 mg Oral Daily  . potassium chloride  10 mEq Oral QPM  . sodium chloride  3 mL Intravenous Q12H   Continuous Infusions: . sodium chloride 100 mL/hr at 02/23/13 5284     Principal Problem:   Dehydration Active Problems:   Hypokalemia   Abdominal pain, left upper quadrant  Time spent: 35  Pamella Pert, MD [facility] Pager [phone]. If 7 PM - 7 AM, please contact night-coverage at [email or web address], password [facility] 02/23/2013, 8:40 AM  LOS: 1 day

## 2013-02-24 ENCOUNTER — Ambulatory Visit: Payer: 59 | Admitting: *Deleted

## 2013-02-24 ENCOUNTER — Telehealth: Payer: Self-pay | Admitting: *Deleted

## 2013-02-24 LAB — BASIC METABOLIC PANEL
Chloride: 102 mEq/L (ref 96–112)
GFR calc Af Amer: >90 mL/min (ref >90)
GFR calc non Af Amer: >90 mL/min (ref >90)
Potassium: 3.4 mEq/L — ABNORMAL LOW (ref 3.5–5.1)

## 2013-02-24 LAB — CBC
HCT: 29.7 % — ABNORMAL LOW (ref 36.0–46.0)
Platelets: 255 10*3/uL (ref 150–400)
RBC: 3.5 MIL/uL — ABNORMAL LOW (ref 3.87–5.11)
RDW: 16.9 % — ABNORMAL HIGH (ref 11.5–15.5)
WBC: 4.9 10*3/uL (ref 4.0–10.5)

## 2013-02-24 MED ORDER — HYDROCODONE-ACETAMINOPHEN 5-325 MG PO TABS: 1.0000 | ORAL_TABLET | Freq: Three times a day (TID) | ORAL | Status: DC | PRN

## 2013-02-24 MED ORDER — POTASSIUM CHLORIDE CRYS ER 20 MEQ PO TBCR
40.0000 meq | EXTENDED_RELEASE_TABLET | Freq: Once | ORAL | Status: AC
  Administered 2013-02-24: 40 meq via ORAL
  Filled 2013-02-24: qty 2

## 2013-02-24 NOTE — Discharge Summary (Signed)
Physician Discharge Summary  Robin Cox:[id number] DOB: 1971-03-22 DOA: 02/22/2013  PCP: Levon Hedger, MD  Admit date: 02/22/2013 Discharge date: 02/24/2013  Time spent: 35 minutes  Recommendations for Outpatient Follow-up:  1. Follow up with surgery in 2 days   Recommendations for next visit:  Repeat BMP  Discharge Diagnoses:  Principal Problem:   Hypokalemia Active Problems:   Anemia   Dehydration   Abdominal pain, left upper quadrant  Discharge Condition: stable  Diet recommendation: bariatric  Filed Weights   02/22/13 2255  Weight: 103.4 kg (227 lb 15.3 oz)   History of present illness:  Robin Cox is a 42 y.o. female who has had a recent gastric bypass surgery in August this year presented to the ER because of left upper quadrant pain with nausea and unable to take anything since morning. In the ER patient was found to be hypokalemic with mild metabolic acidosis. CT abdomen and pelvis was done which did not show anything acute. This time patient has been admitted for observation. On my exam patient states after she had received pain relief medications her left upper quadrant pain has improved. The pain was stabbing in nature radiating around the left flank area. Denies any diarrhea fever chills chest pain shortness of breath. Patient states that after the surgery she did have some nausea which improved.  Hospital Course:  Abdominal pain with nausea and poor oral intake - cause was not clear, but has resolved. Patient's pain is minimal today and she is tolerating diet. She is to follow up with Dr. Johna Sheriff in 2 days.  Hypokalemia and dehydration probably from poor oral intake - potassium has been replaced while hospitalized. She is to resume her home potassium supplements which were discontinued in the past. Patient was advised that when she is seeing surgery in 2 days to ask for repeat K levels.   Anemia - patient states that she has not been taking her iron  pills due to nausea. Stable. Patient states she has an appointment with nutritionist.  History of hypertension - off of her meds since weight loss controlled her blood pressure. Controlled inpatient as well.   Procedures:  none   Consultations:  none  Discharge Exam: Filed Vitals:   02/23/13 1452 02/23/13 2117 02/24/13 0438 02/24/13 0800  BP: 111/79 108/66 115/81 119/79  Pulse: 63 55 63 61  Temp: 98 F (36.7 C) 98.7 F (37.1 C) 98.1 F (36.7 C) 97.7 F (36.5 C)  TempSrc: Oral Oral Oral Oral  Resp: 20 19 18 20   Height:      Weight:      SpO2: 100% 99% 98% 100%   General: NAD Cardiovascular: RRR Abd: soft, non tender, BS+ Respiratory: CTA biL  Discharge Instructions   Future Appointments Provider Department Dept Phone   02/25/2013 9:15 AM Orvil Feil Himmelrich, RD [facility] [phone]   02/26/2013 8:50 AM Mariella Saa, MD [facility] [place] [phone]   08/03/2013 9:00 AM Kendrick Ranch, MD [facility] [place] [phone]       Medication List         albuterol 108 (90 BASE) MCG/ACT inhaler  Commonly known as:  PROVENTIL HFA;VENTOLIN HFA  Inhale 2 puffs into the lungs every 6 (six) hours as needed for wheezing.     HYDROcodone-acetaminophen 5-325 MG per tablet  Commonly known as:  NORCO/VICODIN  Take 1 tablet by mouth every 8 (eight) hours as needed for pain.  INTEGRA 62.5-62.5-40-3 MG Caps  Take 1 capsule by mouth daily.     omeprazole 20 MG capsule  Commonly known as:  PRILOSEC  Take 20 mg by mouth daily.     polyethylene glycol packet  Commonly known as:  MIRALAX / GLYCOLAX  Take 17 g by mouth daily as needed (for constipation).     potassium chloride 10 MEQ tablet  Commonly known as:  K-DUR,KLOR-CON  Take 10 mEq by mouth every evening.           Follow-up Information   Follow up with SCHOENHOFF,DEBBIE, MD. Schedule an appointment as soon as possible for a  visit in 1 week.   Specialty:  Internal Medicine   Contact information:   [address] [place] [address] [phone]       The results of significant diagnostics from this hospitalization (including imaging, microbiology, ancillary and laboratory) are listed below for reference.    Significant Diagnostic Studies: Ct Abdomen Pelvis W Contrast  02/22/2013   CLINICAL DATA:  Mid abdominal pain, nausea, history of gastric sleeve surgery in August 2014, hypertension, initial encounter  EXAM: CT ABDOMEN AND PELVIS WITH CONTRAST  TECHNIQUE: Multidetector CT imaging of the abdomen and pelvis was performed using the standard protocol following bolus administration of intravenous contrast. Sagittal and coronal MPR images reconstructed from axial data set.  CONTRAST:  OMNIPAQUE IOHEXOL 300 MG/ML SOLN Dilute oral contrast.  COMPARISON:  None  FINDINGS: Lung bases clear.  Mild focal fatty infiltration of liver adjacent to falciform fissure.  Liver, spleen, pancreas, kidneys, and adrenal glands normal appearance.  Splenule anterior to spleen.  Normal appendix, retrocecal in position.  Probable leiomyoma at the anterior aspect of the mid uterine segment, 6.4 x 5.8 x 3.5 cm in size.  Otherwise unremarkable bladder, ureters, uterus, and ovaries.  Prior gastric surgery.  Bowel loops normal appearance.  No mass, adenopathy, free fluid, or inflammatory process.  No hernia or acute bone lesion.  Few scattered normal size pelvic and retroperitoneal lymph nodes identified.  IMPRESSION: Prior gastric surgery.  Probable large uterine leiomyoma 6.4 x 5.8 x 3.5 cm.  No acute intra-abdominal or intrapelvic abnormalities.   Electronically Signed   By: Ulyses Southward M.D.   On: 02/22/2013 18:21   Dg Abd Acute W/chest  02/22/2013   CLINICAL DATA:  Abdominal pain. Distention.  EXAM: ACUTE ABDOMEN SERIES (ABDOMEN 2 VIEW & CHEST 1 VIEW)  COMPARISON:  10/09/2012.  FINDINGS: No active cardiopulmonary disease or  free air underneath the hemidiaphragms. Gaseous distension of small bowel loops is present with scattered air-fluid levels. There is no bowel dilation. Paucity of distal rectal gas. Radiopaque densities are present in the right lower quadrant near the cecum, likely representing contents in the enteric stream. Appendicolith is considered unlikely based on the configuration.  IMPRESSION: Nonobstructive bowel gas pattern with scattered air-fluid levels. Although nonspecific, this is most commonly associated with enteric infection.   Electronically Signed   By: Andreas Newport M.D.   On: 02/22/2013 15:59   Labs: Basic Metabolic Panel:  Recent Labs Lab 02/22/13 1534 02/22/13 1541 02/22/13 1628 02/23/13 0448 02/24/13 0445  NA  --  139 135 137 136  K  --  2.8* 2.7* 3.7 3.4*  CL  --  98 94* 101 102  CO2  --   --  24 25 25   GLUCOSE  --  93 91 80 77  BUN  --  4* 7 4* <3*  CREATININE  --  0.90 0.83 0.81 0.76  CALCIUM  --   --  9.3 8.2* 8.4  MG 1.6  --   --   --   --    Liver Function Tests:  Recent Labs Lab 02/22/13 1534  AST 28  ALT 28  ALKPHOS 42  BILITOT 0.2*  PROT 7.6  ALBUMIN 3.5    Recent Labs Lab 02/22/13 1534  LIPASE 73*   CBC:  Recent Labs Lab 02/22/13 1534 02/22/13 1541 02/23/13 0448 02/24/13 0445  WBC 6.6  --  6.1 4.9  NEUTROABS 4.2  --   --   --   HGB 11.2* 12.2 9.4* 9.5*  HCT 33.9* 36.0 29.1* 29.7*  MCV 83.3  --  84.3 84.9  PLT 293  --  198 255   BNP: BNP (last 3 results)  Recent Labs  09/13/12 1110  PROBNP 10.0   Signed:  Taron Conrey  [facility] 02/24/2013, 4:19 PM

## 2013-02-24 NOTE — Telephone Encounter (Signed)
Returned pt call  

## 2013-02-24 NOTE — Progress Notes (Addendum)
Patient having issues with dietary, unable to order, went over options for bariatric advanced diet with patient, chose appropriately and ordered through service response.  Encouraged patient, talked about bariatric diet at home, food choices, chewing appropriately and continuing to follow prescribed bariatric post-op diet.  Will continue to monitor.    Patient missed appoint with RD at [facility], called and rescheduled for patient

## 2013-02-25 ENCOUNTER — Encounter: Payer: 59 | Attending: General Surgery | Admitting: *Deleted

## 2013-02-25 ENCOUNTER — Encounter: Payer: Self-pay | Admitting: *Deleted

## 2013-02-25 DIAGNOSIS — Z713 Dietary counseling and surveillance: Secondary | ICD-10-CM | POA: Insufficient documentation

## 2013-02-25 DIAGNOSIS — E669 Obesity, unspecified: Secondary | ICD-10-CM | POA: Insufficient documentation

## 2013-02-25 NOTE — Patient Instructions (Addendum)
Goals:  Follow Phase 3B: High Protein + Non-Starchy Vegetables  Start only when tolerating protein   Remove skin from raw vegetables  Cook vegetables until soft  Eat 3-6 small meals/snacks, every 3-5 hrs  Increase lean protein foods to meet 60-80g goal  Can try protein bars - watch sugar alcohols and those with too much fiber until stomach returns to normal  Increase fluid intake to 64oz +  Try a bottle with infuser (use fruit to infuse, but don't eat it yet)  Avoid drinking 15 minutes before, during and 30 minutes after eating  Aim for >30 min of physical activity daily  Resume vitamin supplementation

## 2013-02-25 NOTE — Progress Notes (Addendum)
  Follow-up visit:  8 Weeks Post-Operative Sleeve Gastrectomy Surgery  Medical Nutrition Therapy:  Appt start time: 0915 end time:  1000.  Primary concerns today: Post-operative Bariatric Surgery Nutrition Management. Pt recently admitted to [facility] with left upper quadrant pain with nausea and hypokalemia with mild metabolic acidosis. Pain thought to be gas. Discharged yesterday and states she is feeling much better.  Some nausea persists, though doing better with solid protein (especially chicken).   Surgery date: 12/30/12 Surgery type: Sleeve Start weight at [facility]: 271.8 lbs (10/09/12)  Weight today: 229.0 lbs Weight change: 12.0 lb LOSS Total weight loss: 42.8 lbs  Goal weight: 130-150 lbs % goal met: 30-35%  TANITA  BODY COMP RESULTS  12/18/12 01/13/13 02/25/13   BMI (kg/m^2) 50.3 45.5 42.6   Fat Mass (lbs) 114.5 119.0 98.5   Fat Free Mass (lbs) 151.5 122.0 130.5   Total Body Water (lbs) 111.0 89.5 95.5   Fluid intake: Water, popsicles, hot tea = 64+ oz Estimated total protein intake: < 60g - increasing as able since discharged from [facility]  Medications: See medication  Supplementation: Not taking at this time. Plans to resume as stomach calms  Using straws: No Drinking while eating: No Hair loss: No Carbonated beverages: No N/V/D/C: Diarrhea reported d/t enema given in hospital. Also feels nausea at times, usually before meals. Likely d/t hunger.  Dumping syndrome: None  Recent physical activity:  Walking tape at home for 15-30 min, 3x/week.  Also walks outside 3x/week 15-30 min  Progress Towards Goal(s):  In progress.  Handouts given during visit include:  Stage 3B: High Protein + Non-Starchy Vegetables  Samples given during visit include:   Nascobal B12 Nasal Spray: 1 ea Lot: [id number]; Exp: 07/15   BariActiv: 1 ea Lot: [id number]; Exp: 05/16 Lot: [id number]: Exp: 05/16 Lot: [id number]; Exp: 05/16   Unjury Protein Powder: 2 ea Lot: [id number]; Exp: 11/15 Lot: [id number]; Exp:  12/15 Lot: [id number]; Exp: 12/15 Lot: [id number]; Exp: 12/15 Lot: [id number]; Exp: 09/15   PB2: 1 pkt Lot: NONE; Exp: 06/09/13   Nutritional Diagnosis:  [place]-3.3 Overweight/obesity related to past poor dietary habits and physical inactivity as evidenced by patient w/ recent Sleeve Gastrectomy surgery following dietary guidelines for continued weight loss.    Intervention:  Nutrition education/diet advancement.  Monitoring/Evaluation:  Dietary intake, exercise, and body weight. Follow up in 1 months for 3 month post-op visit.

## 2013-02-26 ENCOUNTER — Encounter (INDEPENDENT_AMBULATORY_CARE_PROVIDER_SITE_OTHER): Payer: Self-pay | Admitting: General Surgery

## 2013-02-26 ENCOUNTER — Ambulatory Visit (INDEPENDENT_AMBULATORY_CARE_PROVIDER_SITE_OTHER): Payer: 59 | Admitting: General Surgery

## 2013-02-26 NOTE — Patient Instructions (Addendum)
Use Benefiber daily. Using MiraLAX as needed as often as needed.

## 2013-02-26 NOTE — Progress Notes (Signed)
Chief complaint: Followup sleeve gastrectomy  History: Patient returns to the office 2 months following sleeve gastrectomy. She apparently developed acute left upper quadrant abdominal pain about 5 days ago and presented to the emergency department. I was not aware of this. She was found to be hypokalemic and was admitted for potassium replacement. CT scan of the abdomen and pelvis showed no complication from her surgery or other source for pain. She was felt possibly to be constipated and was given an enema with good results would seem to help the pain significantly. Overall she still gets a little bit of discomfort over the last few days in the left side of her abdomen but nothing severe. She is filling up quickly with a small amount of food which does not cause pain or nausea. She does feel occasionally nauseated when her stomach is empty.  Exam: BP 142/90  Pulse 76  Temp(Src) 97.7 F (36.5 C) (Temporal)  Resp 14  Ht 5' 1.25" (1.556 m)  Wt 228 lb 12.8 oz (103.783 kg)  BMI 42.87 kg/m2  LMP 02/13/2013 Total weight loss 34 pounds, 8 pounds since last visit General: In no distress and appears well Lungs: Clear breath sounds bilaterally Abdomen: Soft and nontender. Incisions all fine without hernias.  Assessment and plan: Appears to be doing well at this point following sleeve gastrectomy with no complications identified. It sounds to me as if her pain is likely lower intestinal possibly related to constipation. I recommend a daily fiber supplement and MiraLAX as needed. She will call for any recurrent symptoms otherwise return in one month. We reviewed diet and exercise Strategies.

## 2013-03-02 NOTE — Patient Instructions (Addendum)
Patient to follow Phase 3A-Soft, High Protein Diet and follow-up at [facility] in 6 weeks for 2 months post-op nutrition visit for diet advancement. 

## 2013-03-02 NOTE — Progress Notes (Signed)
  Bariatric Class:  Appt start time: 1600 end time:  1700.  2 Week Post-Operative Nutrition Class  Patient was seen on 01/13/13 for Post-Operative [facility].   Surgery date: 12/30/12  Surgery type: Sleeve  Start weight at [facility]: 271.8 lbs (10/09/12)  Goal weight: 130-150 lbs   Weight today: 241.0 lbs  Weight change:  - 25.0 lbs Total weight lost:  30.8 lbs  TANITA BODY COMP RESULTS   12/18/12  01/13/13  BMI (kg/m^2)  50.3  45.5  Fat Mass (lbs)  114.5  119.0  Fat Free Mass (lbs)  151.5  122.0  Total Body Water (lbs)  111.0  89.5   The following the learning objective met the patient during this course:  Identifies Phase 3A (Soft, High Proteins) Dietary Goals and will begin from 2 weeks post-operatively to 2 months post-operatively  Identifies appropriate sources of fluids and proteins   States protein recommendations and appropriate sources post-operatively  Identifies the need for appropriate texture modifications, mastication, and bite sizes when consuming solids  Identifies appropriate multivitamin and calcium sources post-operatively  Describes the need for physical activity post-operatively and will follow MD recommendations  States when to call healthcare provider regarding medication questions or post-operative complications  Handouts given during class include:  Phase 3A: Soft, High Protein Diet Handout  Follow-Up Plan: Patient will follow-up at [facility] in 6 weeks for 2 months post-op nutrition visit for diet advancement per MD.

## 2013-03-03 ENCOUNTER — Other Ambulatory Visit: Payer: Self-pay

## 2013-03-03 DIAGNOSIS — Z1231 Encounter for screening mammogram for malignant neoplasm of breast: Secondary | ICD-10-CM

## 2013-03-06 NOTE — ED Provider Notes (Signed)
Medical screening examination/treatment/procedure(s) were performed by non-physician practitioner and as supervising physician I was immediately available for consultation/collaboration.    Jonathan Corpus L Manoah Deckard, MD 03/06/13 0801 

## 2013-03-10 ENCOUNTER — Ambulatory Visit
Admission: RE | Admit: 2013-03-10 | Discharge: 2013-03-10 | Disposition: A | Discharge status: Home / Self Care | Payer: 59 | Source: Ambulatory Visit

## 2013-03-10 DIAGNOSIS — Z1231 Encounter for screening mammogram for malignant neoplasm of breast: Secondary | ICD-10-CM

## 2013-03-25 ENCOUNTER — Ambulatory Visit (INDEPENDENT_AMBULATORY_CARE_PROVIDER_SITE_OTHER): Payer: 59 | Admitting: General Surgery

## 2013-03-26 ENCOUNTER — Ambulatory Visit: Payer: 59 | Admitting: *Deleted

## 2013-03-28 ENCOUNTER — Encounter: Payer: 59 | Attending: General Surgery | Admitting: Dietician

## 2013-03-28 DIAGNOSIS — Z713 Dietary counseling and surveillance: Secondary | ICD-10-CM | POA: Insufficient documentation

## 2013-03-28 DIAGNOSIS — E669 Obesity, unspecified: Secondary | ICD-10-CM | POA: Insufficient documentation

## 2013-03-28 NOTE — Patient Instructions (Signed)
Goals:  Follow Phase 3B: High Protein + Non-Starchy Vegetables  Eat 3-6 small meals/snacks, every 3-5 hrs  Increase lean protein foods to meet 60-80g goal  Increase fluid intake to 64oz +  Avoid drinking 15 minutes before, during and 30 minutes after eating  Aim for >30 min of physical activity daily  Continue vitamin supplementation  Avoid having fruit (bananas) until you are at 75% of goal   Limit carbohydrate to 15 g per meal or snack

## 2013-03-28 NOTE — Progress Notes (Signed)
  Follow-up visit:  12 Weeks Post-Operative Sleeve Gastrectomy Surgery  Medical Nutrition Therapy:  Appt start time: 200 end time:  230.  Primary concerns today: Post-operative Bariatric Surgery Nutrition Management. Robin Cox returns today for 12 week follow up. Stated that she ate half a "grab" bag size of potato chips and did not feel full afterwards. Tolerating all foods ok. Works 3rd shift.  Surgery date: 12/30/12 Surgery type: Sleeve Start weight at [facility]: 271.8 lbs (10/09/12)  Weight today: 210.5 lbs Weight change: 18.5 lb LOSS Total weight loss: 61.3 lbs  Goal weight: 130-150 lbs % goal met: 43-50%  TANITA  BODY COMP RESULTS  12/18/12 01/13/13 02/25/13 03/28/13   BMI (kg/m^2) 50.3 45.5 42.6 38.5   Fat Mass (lbs) 114.5 119.0 98.5 90.5   Fat Free Mass (lbs) 151.5 122.0 130.5 120.0   Total Body Water (lbs) 111.0 89.5 95.5 88.0   Fluid intake: Water, popsicles, hot tea = 64+ oz most days 24 hour intake: B: two [place] sausages with cheese 15 g protein  S: banana L: 3-4 oz tuna or chicken with broccoli, beans 21-28 g protein S: protein shake 30 g (Premier)  D: 3-4 oz tuna or chicken with broccoli, beans 21-28 g protein  Estimated total protein intake: 87-100 g protein  Medications: See medication  Supplementation: Taking  Using straws: No Drinking while eating: No Hair loss: Yes Carbonated beverages: No N/V/D/C: Constipation, treats with Miralax Dumping syndrome: None  Recent physical activity:  Walking, weights, couch to 5 k, 45-90 min 5 x week  Progress Towards Goal(s):  In progress.  Handouts given during visit include:  Stage 3B: High Protein + Non-Starchy Vegetables   Nutritional Diagnosis:  [place]-3.3 Overweight/obesity related to past poor dietary habits and physical inactivity as evidenced by patient w/ recent Sleeve Gastrectomy surgery following dietary guidelines for continued weight loss.    Intervention:  Nutrition education/diet  advancement.  Monitoring/Evaluation:  Dietary intake, exercise, and body weight. Follow up in 3 months for 6 month post-op visit.

## 2013-04-22 ENCOUNTER — Ambulatory Visit (INDEPENDENT_AMBULATORY_CARE_PROVIDER_SITE_OTHER): Payer: 59 | Admitting: General Surgery

## 2013-04-22 ENCOUNTER — Encounter (INDEPENDENT_AMBULATORY_CARE_PROVIDER_SITE_OTHER): Payer: Self-pay | Admitting: General Surgery

## 2013-04-22 VITALS — BP 134/84 | HR 72 | Temp 98.6°F | Resp 14 | Ht 60.5 in | Wt 201.6 lb

## 2013-04-22 DIAGNOSIS — Z09 Encounter for follow-up examination after completed treatment for conditions other than malignant neoplasm: Secondary | ICD-10-CM

## 2013-04-22 NOTE — Progress Notes (Signed)
Chief complaint: Followup sleeve gastrectomy  History: The patient returns for followup now 3 months following laparoscopic sleeve gastrectomy for morbid obesity and comorbidities of obstructive sleep apnea hypertension and lower extremity edema. She continues to do extremely well. She denies any GI complaints, specifically pain or food intolerance. She fills up with an appropriate amount of solid food. She uses her CPAP now only occasionally and feels she is sleeping well. Lower extremity edema is resolved and she has been off her antihypertensive.  Exam: BP 134/84  Pulse 72  Temp(Src) 98.6 F (37 C) (Temporal)  Resp 14  Ht 5' 0.5" (1.537 m)  Wt 201 lb 9.6 oz (91.445 kg)  BMI 38.71 kg/m2 Total weight loss 61 pounds, 27 pounds his last visit General: Appears well Abdomen: Soft and nontender. Incisions are well-healed without hernias.  Assessment and plan: Doing extremely well following this a gastrectomy without complication, excellent weight loss and early resolution of comorbidities. Review diet and exercise strategies. She will return in 3 months.

## 2013-06-29 ENCOUNTER — Encounter: Payer: 59 | Attending: General Surgery | Admitting: Dietician

## 2013-06-29 DIAGNOSIS — Z713 Dietary counseling and surveillance: Secondary | ICD-10-CM | POA: Insufficient documentation

## 2013-06-29 DIAGNOSIS — E669 Obesity, unspecified: Secondary | ICD-10-CM | POA: Insufficient documentation

## 2013-06-29 NOTE — Progress Notes (Addendum)
  Follow-up visit:  12 Weeks Post-Operative Sleeve Gastrectomy Surgery  Medical Nutrition Therapy:  Appt start time: 800 end time:  830.  Primary concerns today: Post-operative Bariatric Surgery Nutrition Management. Robin Cox returns today with a 24.5 lbs weight loss. Having some cravings sometimes for sweets and salty - corn chips, chocolate covered banana, and weight watchers ice cream. Reports tolerating everything as long as she doesn't eat too much.    Surgery date: 12/30/12 Surgery type: Sleeve Start weight at [facility]: 271.8 lbs (10/09/12)  Weight today: 186.0 lbs  Weight change: 24.5 lb LOSS Total weight loss: 85.8 lbs  Goal weight: 130-140 lbs % goal met: 61-65%  TANITA  BODY COMP RESULTS  12/18/12 01/13/13 02/25/13 03/28/13 06/29/13   BMI (kg/m^2) 50.3 45.5 42.6 38.5 34.0   Fat Mass (lbs) 114.5 119.0 98.5 90.5 70.0   Fat Free Mass (lbs) 151.5 122.0 130.5 120.0 116.0   Total Body Water (lbs) 111.0 89.5 95.5 88.0 85.0   Fluid intake: Water, popsicles, hot tea, sometimes a little juice = 64+ oz most days  24 hour intake: B: 1 [place] sausages, 1 egg, with cheese 15 g protein  S: Greek yogurt or cheese or [place] jerky (6-12 g) L: 3-4 oz tuna or chicken with broccoli, beans 21-28 g protein S: protein shake 30 g (Premier) or [place] yogurt or cheese or [place] jerky (6-12 g) D: 3-4 oz tuna or chicken with broccoli, beans 21-28 g protein  Estimated total protein intake: 87-100 g protein  Medications: See medication  Supplementation: Taking  Using straws: No Drinking while eating: No Hair loss: Yes -  Getting better with biotin  Carbonated beverages: No N/V/D/C: little constipation, treats with Miralax Dumping syndrome: None  Recent physical activity:  Spin class, body pump, walking, weights, 45-90 min 6 x week   Progress Towards Goal(s):  In progress.   Nutritional Diagnosis:  [place]-3.3 Overweight/obesity related to past poor dietary habits and physical inactivity as evidenced by  patient w/ recent Sleeve Gastrectomy surgery following dietary guidelines for continued weight loss.    Intervention:  Nutrition education/diet reinforcement.  Monitoring/Evaluation:  Dietary intake, exercise, and body weight. Follow up in 3 months for 9 month post-op visit.

## 2013-06-29 NOTE — Patient Instructions (Addendum)
Goals:  Follow Phase 3B: High Protein + Non-Starchy Vegetables  Eat 3-6 small meals/snacks, every 3-5 hrs  Increase lean protein foods to meet 60-80g goal  Increase fluid intake to 64oz +  Avoid drinking 15 minutes before, during and 30 minutes after eating  Aim for >30 min of physical activity daily  Limit carbohydrate to 15 g per meal or snack   Look for protein bars when looking for something sweet and Quest Protein chips when you want something salty  Weight today: 186.0 lbs Weight change: 24.5 lb LOSS Total weight loss: 85.8 lbs Goal weight: 130-150 lbs % goal met: 43-50%  TANITA  BODY COMP RESULTS  12/18/12 01/13/13 02/25/13 03/28/13 06/29/13   BMI (kg/m^2) 50.3 45.5 42.6 38.5 34.0   Fat Mass (lbs) 114.5 119.0 98.5 90.5 70.0   Fat Free Mass (lbs) 151.5 122.0 130.5 120.0 116.0   Total Body Water (lbs) 111.0 89.5 95.5 88.0 85.0

## 2013-07-08 ENCOUNTER — Encounter (INDEPENDENT_AMBULATORY_CARE_PROVIDER_SITE_OTHER): Payer: Self-pay | Admitting: General Surgery

## 2013-07-08 ENCOUNTER — Ambulatory Visit (INDEPENDENT_AMBULATORY_CARE_PROVIDER_SITE_OTHER): Payer: 59 | Admitting: General Surgery

## 2013-07-08 DIAGNOSIS — Z09 Encounter for follow-up examination after completed treatment for conditions other than malignant neoplasm: Secondary | ICD-10-CM

## 2013-07-08 DIAGNOSIS — Z9884 Bariatric surgery status: Secondary | ICD-10-CM

## 2013-07-08 LAB — CBC WITH DIFFERENTIAL/PLATELET
Basophils Absolute: 0 10*3/uL (ref 0.0–0.2)
Basos: 0 %
Eos: 1 %
Eosinophils Absolute: 0.1 10*3/uL (ref 0.0–0.4)
HCT: 31.7 % — ABNORMAL LOW (ref 34.0–46.6)
HEMOGLOBIN: 10.4 g/dL — AB (ref 11.1–15.9)
Immature Grans (Abs): 0 10*3/uL (ref 0.0–0.1)
Immature Granulocytes: 0 %
Lymphocytes Absolute: 3.4 10*3/uL — ABNORMAL HIGH (ref 0.7–3.1)
Lymphs: 44 %
MCH: 28.3 pg (ref 26.6–33.0)
MCHC: 32.8 g/dL (ref 31.5–35.7)
MCV: 86 fL (ref 79–97)
MONOCYTES: 7 %
Monocytes Absolute: 0.6 10*3/uL (ref 0.1–0.9)
NEUTROS ABS: 3.6 10*3/uL (ref 1.4–7.0)
NEUTROS PCT: 48 %
RBC: 3.67 x10E6/uL — AB (ref 3.77–5.28)
RDW: 15.1 % (ref 12.3–15.4)
WBC: 7.7 10*3/uL (ref 3.4–10.8)

## 2013-07-08 NOTE — Progress Notes (Signed)
Chief complaint followup sleeve gastrectomy for morbid obesity  History: Patient returns for routine followup 6 months after arthroscopic sleeve gastrectomy for morbid obesity. She is getting along quite well. No significant GI problems such as pain or vomiting or reflux. She still fills up quickly with an appropriate amount of food. She is exercising regular. She is off her antihypertensives and off for CPAP with no symptoms to suggest ongoing sleep apnea.  Exam: BP 110/82  Pulse 68  Temp(Src) 97.7 F (36.5 C) (Oral)  Resp 14  Ht 5' 0.5" (1.537 m)  Wt 188 lb 9.6 oz (85.548 kg)  BMI 36.21 kg/m2 Total weight loss 74 pounds, 13 pounds since last visit General: Appears well Abdomen: Soft and nontender. Wounds will heal. No hernias.  Assessment and plan: Doing well without complication, and ongoing weight loss and resolution of hypertension and sleep apnea. We discussed diet exercise stratrgies. Check lab work today. Return in 3 months.

## 2013-07-09 LAB — COMPREHENSIVE METABOLIC PANEL
ALBUMIN: 3.8 g/dL (ref 3.5–5.5)
ALT: 9 IU/L (ref 0–32)
AST: 18 IU/L (ref 0–40)
Albumin/Globulin Ratio: 1.5 (ref 1.1–2.5)
Alkaline Phosphatase: 47 IU/L (ref 39–117)
BUN/Creatinine Ratio: 18 (ref 9–23)
BUN: 16 mg/dL (ref 6–24)
CO2: 24 mmol/L (ref 18–29)
Calcium: 9.7 mg/dL (ref 8.7–10.2)
Chloride: 98 mmol/L (ref 97–108)
Creatinine, Ser: 0.9 mg/dL (ref 0.57–1.00)
GFR calc non Af Amer: 80 mL/min/{1.73_m2} (ref >59)
GFR, EST AFRICAN AMERICAN: 92 mL/min/{1.73_m2} (ref >59)
GLUCOSE: 89 mg/dL (ref 65–99)
Globulin, Total: 2.6 g/dL (ref 1.5–4.5)
Potassium: 5.2 mmol/L (ref 3.5–5.2)
Sodium: 135 mmol/L (ref 134–144)
Total Protein: 6.4 g/dL (ref 6.0–8.5)

## 2013-07-09 LAB — IRON AND TIBC
IRON: 45 ug/dL (ref 35–155)
Iron Saturation: 17 % (ref 15–55)
TIBC: 270 ug/dL (ref 250–450)
UIBC: 225 ug/dL (ref 150–375)

## 2013-08-03 ENCOUNTER — Encounter: Payer: Self-pay | Admitting: *Deleted

## 2013-08-03 ENCOUNTER — Encounter: Payer: Self-pay | Admitting: Internal Medicine

## 2013-08-03 ENCOUNTER — Telehealth (INDEPENDENT_AMBULATORY_CARE_PROVIDER_SITE_OTHER): Payer: Self-pay

## 2013-08-03 ENCOUNTER — Ambulatory Visit (INDEPENDENT_AMBULATORY_CARE_PROVIDER_SITE_OTHER): Payer: 59 | Admitting: Internal Medicine

## 2013-08-03 VITALS — BP 122/74 | HR 67 | Temp 98.1°F | Resp 16 | Wt 184.0 lb

## 2013-08-03 DIAGNOSIS — Z0000 Encounter for general adult medical examination without abnormal findings: Secondary | ICD-10-CM

## 2013-08-03 DIAGNOSIS — G4733 Obstructive sleep apnea (adult) (pediatric): Secondary | ICD-10-CM

## 2013-08-03 DIAGNOSIS — Z9884 Bariatric surgery status: Secondary | ICD-10-CM

## 2013-08-03 DIAGNOSIS — D649 Anemia, unspecified: Secondary | ICD-10-CM

## 2013-08-03 DIAGNOSIS — E669 Obesity, unspecified: Secondary | ICD-10-CM

## 2013-08-03 LAB — POCT URINALYSIS DIPSTICK
Bilirubin, UA: NEGATIVE
Glucose, UA: NEGATIVE
Ketones, UA: NEGATIVE
Leukocytes, UA: NEGATIVE
NITRITE UA: NEGATIVE
PH UA: 5.5
PROTEIN UA: NEGATIVE
RBC UA: NEGATIVE
Spec Grav, UA: 1.02
Urobilinogen, UA: NEGATIVE

## 2013-08-03 MED ORDER — INTEGRA 62.5-62.5-40-3 MG PO CAPS: 1.0000 | ORAL_CAPSULE | Freq: Every day | ORAL | Status: DC

## 2013-08-03 NOTE — Progress Notes (Signed)
   Subjective:    Patient ID: Robin Cox, female    DOB: 08-26-1970, 43 y.o.   MRN: [id number]  HPI  Robin Cox is here for her annual exam  Pap done per GYN  Dr. Garwin Brothers  Obesity  :  She has lost a total of 82 lbs since her gastric sleeve.  She is taking vitamins except she stopped taking iron.  She had on hospitlalizaiton for dehydration and electrolyte imbalance   She does have heavy menses that is under evaluation by Dr. Garwin Brothers  Review of Systems     Objective:   Physical Exam Physical Exam  Nursing note and vitals reviewed.  Constitutional: She is oriented to person, place, and time. She appears well-developed and well-nourished.  HENT:  Head: Normocephalic and atraumatic.  Right Ear: Tympanic membrane and ear canal normal. No drainage. Tympanic membrane is not injected and not erythematous.  Left Ear: Tympanic membrane and ear canal normal. No drainage. Tympanic membrane is not injected and not erythematous.  Nose: Nose normal. Right sinus exhibits no maxillary sinus tenderness and no frontal sinus tenderness. Left sinus exhibits no maxillary sinus tenderness and no frontal sinus tenderness.  Mouth/Throat: Oropharynx is clear and moist. No oral lesions. No oropharyngeal exudate.  Eyes: Conjunctivae and EOM are normal. Pupils are equal, round, and reactive to light.  Neck: Normal range of motion. Neck supple. No JVD present. Carotid bruit is not present. No mass and no thyromegaly present.  Cardiovascular: Normal rate, regular rhythm, S1 normal, S2 normal and intact distal pulses. Exam reveals no gallop and no friction rub.  No murmur heard.  Pulses:  Carotid pulses are 2+ on the right side, and 2+ on the left side.  Dorsalis pedis pulses are 2+ on the right side, and 2+ on the left side.  No carotid bruit. No LE edema  Pulmonary/Chest: Breath sounds normal. She has no wheezes. She has no rales. She exhibits no tenderness.  Breasts  No discrete masses no nipple discharge no  axillary adenopathy bilaterall Abdominal: Soft. Bowel sounds are normal. She exhibits no distension and no mass. There is no hepatosplenomegaly. There is no tenderness. There is no CVA tenderness.  Musculoskeletal: Normal range of motion.  No active synovitis to joints.  Lymphadenopathy:  She has no cervical adenopathy.  She has no axillary adenopathy.  Right: No inguinal and no supraclavicular adenopathy present.  Left: No inguinal and no supraclavicular adenopathy present.  Neurological: She is alert and oriented to person, place, and time. She has normal strength and normal reflexes. She displays no tremor. No cranial nerve deficit or sensory deficit. Coordination and gait normal.  Skin: Skin is warm and dry. No rash noted. No cyanosis. Nails show no clubbing.  Psychiatric: She has a normal mood and affect. Her speech is normal and behavior is normal. Cognition and memory are normal.          Assessment & Plan:  Health Maintenance  Pap upcoming with her GYN    UTD with vaccines  See scanned sheet  DUB  Upcoming appt with GYN to discuss options  Anemia  Likely due to heavy menses .  Will check anemia panel and resume her Integra ddaily  HTN  Now resolved  Obesity    S/P gastric sleeve:  NO off anti-hypertensives and off CPAP  See me as needed

## 2013-08-03 NOTE — Telephone Encounter (Signed)
Message copied by Ivor Costa on Mon Aug 03, 2013 11:35 AM ------      Message from: Humphrey Rolls K      Created: Wed Jul 29, 2013 11:31 AM      Regarding: Dr Excell Seltzer      Contact: [phone]       Would like lab results from 07/09/13 ------

## 2013-08-03 NOTE — Patient Instructions (Signed)
See me as needed 

## 2013-08-03 NOTE — Telephone Encounter (Signed)
Called and spoke to patient to make aware that her lab results from 07/09/13 were Normal

## 2013-08-06 ENCOUNTER — Encounter: Payer: Self-pay | Admitting: *Deleted

## 2013-08-13 ENCOUNTER — Encounter: Payer: Self-pay | Admitting: *Deleted

## 2013-09-21 ENCOUNTER — Encounter: Payer: 59 | Attending: General Surgery | Admitting: Dietician

## 2013-09-21 VITALS — Ht 62.0 in | Wt 179.5 lb

## 2013-09-21 DIAGNOSIS — E669 Obesity, unspecified: Secondary | ICD-10-CM | POA: Insufficient documentation

## 2013-09-21 DIAGNOSIS — Z713 Dietary counseling and surveillance: Secondary | ICD-10-CM | POA: Insufficient documentation

## 2013-09-21 NOTE — Progress Notes (Signed)
  Follow-up visit:  9 Month Post-Operative Sleeve Gastrectomy Surgery  Medical Nutrition Therapy:  Appt start time: 1783 end time:  1215.  Primary concerns today: Post-operative Bariatric Surgery Nutrition Management. Theadora returns today with a 6.5 lb loss. Feels like she has been snacking a lot. 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 Having some cravings, sweets and salty. Has been wanting to have a lot of yogurt. Having some bread and rice sometimes. Snacking on popcorn, candy, chips (Doritos) about 2 x week. Tolerating all foods except untoasted bread or potatoes.   Internship is over now and planning to "get back to exercise more" and "getting eating back on track". Training for a half marathon!   Surgery date: 12/30/12 Surgery type: Sleeve Start weight at [facility]: 271.8 lbs (10/09/12)  Weight today: 179.5 lbs  Weight change: 6.5 lb LOSS Total weight loss: 92.3 lbs  Goal weight: 130-140 lbs % goal met: 61-65%  TANITA  BODY COMP RESULTS  12/18/12 01/13/13 02/25/13 03/28/13 06/29/13 09/21/13   BMI (kg/m^2) 50.3 45.5 42.6 38.5 34.0 32.8   Fat Mass (lbs) 114.5 119.0 98.5 90.5 70.0 68.5   Fat Free Mass (lbs) 151.5 122.0 130.5 120.0 116.0 111.0   Total Body Water (lbs) 111.0 89.5 95.5 88.0 85.0 81.5   Fluid intake: Water, sometimes a little juice 1 x week = 64+ oz most days  24 hour intake: B: 1 [place] sausages, 1 egg, with cheese 15 g protein  S: Greek yogurt or cheese or [place] jerky (6-12 g) L: 3-4 oz tuna or chicken with broccoli, beans 21-28 g protein S: Greek yogurt or cheese or [place] jerky (6-12 g) D: 3-4 oz tuna or chicken with broccoli, beans 21-28 g protein  Estimated total protein intake: 69-95 g protein  Medications: See medication  Supplementation: Taking  Using straws: No Drinking while eating: No Hair loss: getting better Carbonated beverages: every once in a while N/V/D/C: No Dumping syndrome: None  Recent physical activity:  Spin class, body pump,  walking, weights, 45-90 min 3 x week   Progress Towards Goal(s):  In progress.   Nutritional Diagnosis:  [place]-3.3 Overweight/obesity related to past poor dietary habits and physical inactivity as evidenced by patient w/ recent Sleeve Gastrectomy surgery following dietary guidelines for continued weight loss.    Intervention:  Nutrition education/diet reinforcement.  Monitoring/Evaluation:  Dietary intake, exercise, and body weight. Follow up in 3 months for 12 month post-op visit.

## 2013-09-21 NOTE — Patient Instructions (Addendum)
Goals:  Follow Phase 3B: High Protein + Non-Starchy Vegetables  Eat 3-6 small meals/snacks, every 3-5 hrs  Increase lean protein foods to meet 60-80g goal  Increase fluid intake to 64oz +  Avoid drinking 15 minutes before, during and 30 minutes after eating  Aim for >30 min of physical activity daily  Limit carbohydrate to 15 g per meal or snack   Look for protein bars when looking for something sweet and [employer] Protein chips when you want something salty  Have 1 yogurt per day  To help facilitate weight loss, try cutting back on things like: granola, juice, fruit, rice, chips, candy  If you are having a chocolate craving, try a square of dark (70% bar)  Add vegetables to eggs in the morning  Surgery date: 12/30/12 Surgery type: Sleeve Start weight at [facility]: 271.8 lbs (10/09/12)  Weight today: 179.5 lbs  Weight change: 6.5 lb LOSS Total weight loss: 92.3 lbs  TANITA  BODY COMP RESULTS  12/18/12 01/13/13 02/25/13 03/28/13 06/29/13 09/21/13   BMI (kg/m^2) 50.3 45.5 42.6 38.5 34.0 32.8   Fat Mass (lbs) 114.5 119.0 98.5 90.5 70.0 68.5   Fat Free Mass (lbs) 151.5 122.0 130.5 120.0 116.0 111.0   Total Body Water (lbs) 111.0 89.5 95.5 88.0 85.0 81.5

## 2013-09-28 ENCOUNTER — Ambulatory Visit: Payer: 59 | Admitting: Dietician

## 2013-10-02 ENCOUNTER — Ambulatory Visit (INDEPENDENT_AMBULATORY_CARE_PROVIDER_SITE_OTHER): Payer: 59 | Admitting: General Surgery

## 2013-10-02 ENCOUNTER — Encounter (INDEPENDENT_AMBULATORY_CARE_PROVIDER_SITE_OTHER): Payer: Self-pay | Admitting: General Surgery

## 2013-10-02 VITALS — BP 118/76 | HR 74 | Temp 97.8°F | Ht 61.0 in | Wt 178.0 lb

## 2013-10-02 DIAGNOSIS — Z9884 Bariatric surgery status: Secondary | ICD-10-CM

## 2013-10-02 NOTE — Progress Notes (Signed)
Chief complaint: Followup sleeve gastrectomy  History: This returns for followup now 9 months following laparoscopic sleeve gastrectomy for morbid obesity. She had comorbidity of hypertension now resolved off medications. She continues to feel well. She has appropriate restriction. No vomiting or abdominal pain. Due to school she's been having trouble finding time to exercise but now that school is ending she plans to rededicate herself to this. Generally her food choices seem appropriate.  Past Medical History  Diagnosis Date  . Hypertension   . Allergy   . Sleep apnea   . Morbid obesity   . Shortness of breath oct 2013    pt given inhaler for use as needed for wheezing and sob-that has helped - no diagnosis of asthma or respiratory problems  . Headache(784.0)     h/a's prior to starting b/p medication - no problems since  . Anemia     pt on iron pills   Past Surgical History  Procedure Laterality Date  . Dilation and curettage of uterus  1990  . Laparoscopic gastric sleeve resection N/A 12/30/2012    Procedure: LAPAROSCOPIC GASTRIC SLEEVE RESECTION;  Surgeon: Edward Jolly, MD;  Location: [facility];  Service: General;  Laterality: N/A;  Laparoscopic Sleeve Gastrectomy   . Esophagogastroduodenoscopy N/A 12/30/2012    Procedure: ESOPHAGOGASTRODUODENOSCOPY (EGD);  Surgeon: Edward Jolly, MD;  Location: [facility];  Service: General;  Laterality: N/A;   Exam: BP 118/76  Pulse 74  Temp(Src) 97.8 F (36.6 C)  Ht 5\' 1"  (1.549 m)  Wt 178 lb (80.74 kg)  BMI 33.65 kg/m2 Total weight loss 85 pounds, 12 pounds since last visit General: Appears well Lungs: Clear breath sounds Skin: The retroflexion Abdomen: Soft and nontender. No hernias.  Assessment and plan: Doing very well following laparoscopic sleeve gastrectomy with resolution of hypertension and clinical resolution of sleep apnea. No concerns identified at present. Return in 3 months for her one-year followup.

## 2013-10-22 ENCOUNTER — Ambulatory Visit (INDEPENDENT_AMBULATORY_CARE_PROVIDER_SITE_OTHER): Payer: 59 | Admitting: General Surgery

## 2013-12-30 ENCOUNTER — Ambulatory Visit: Payer: 59 | Admitting: Dietician

## 2014-01-03 IMAGING — CR DG UGI W/ KUB
15 of 21 series · 15 of 21 positions shown · non-contrast
Comparison: None.

CLINICAL DATA: Preoperative evaluation for bariatric sleeve
placement.

UPPER GI SERIES W/ KUB
TECHNIQUE: After obtaining a scout radiograph a single-column
upper GI series was performed using thin barium.
Fluoroscopy Time: 2 minute 42 seconds

[run (1 of 10)]
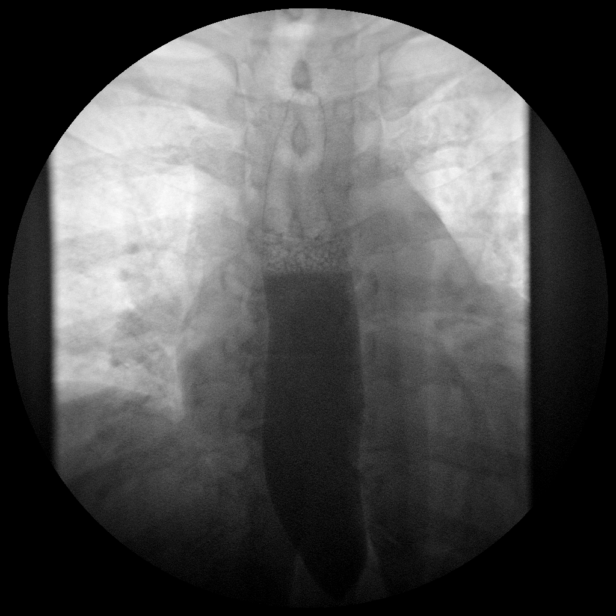

[run (2 of 10)]
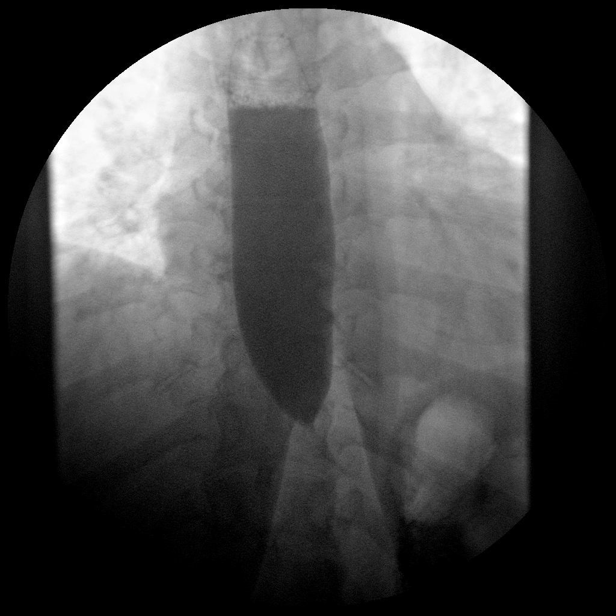

[run (3 of 10)]
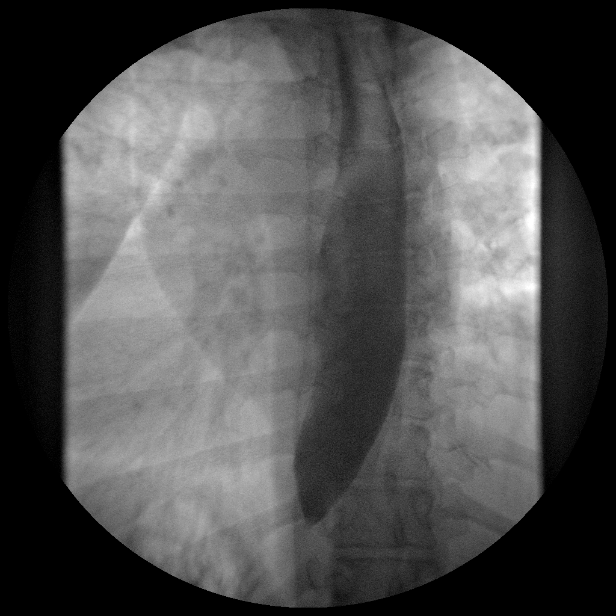

[run (4 of 10)]
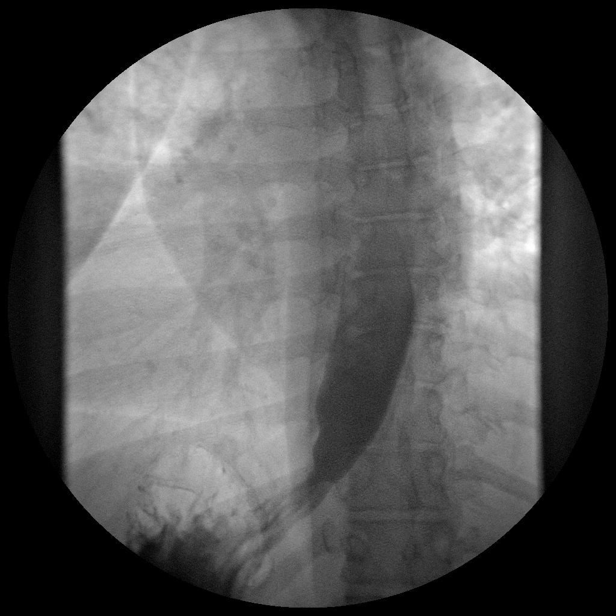

[run (5 of 10)]
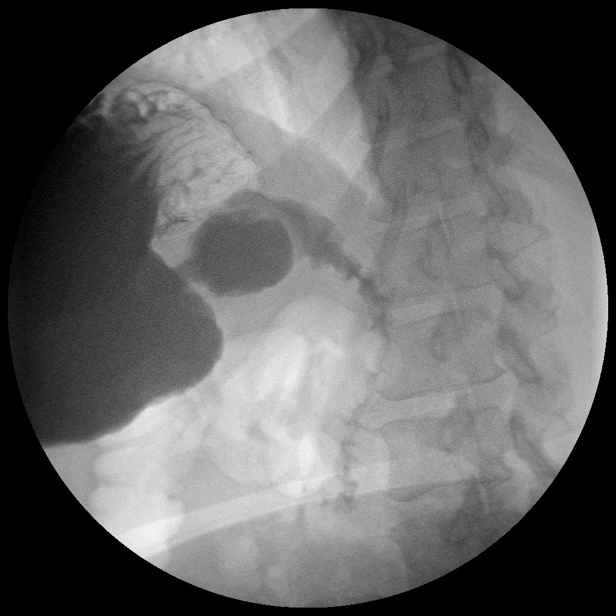

[run (6 of 10)]
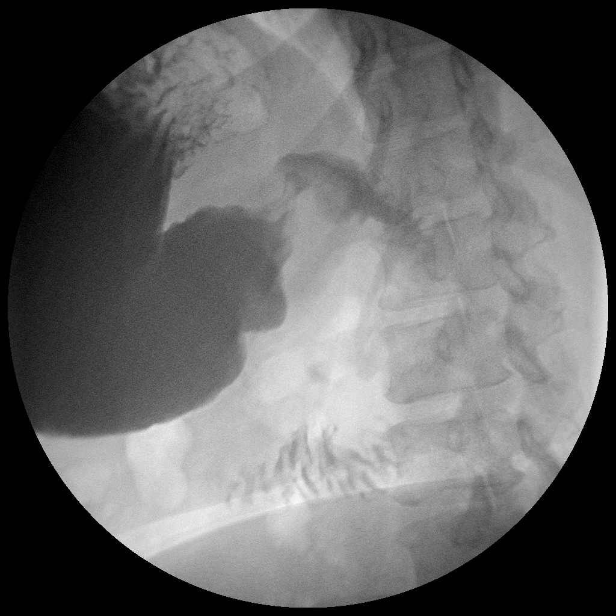

[run (7 of 10)]
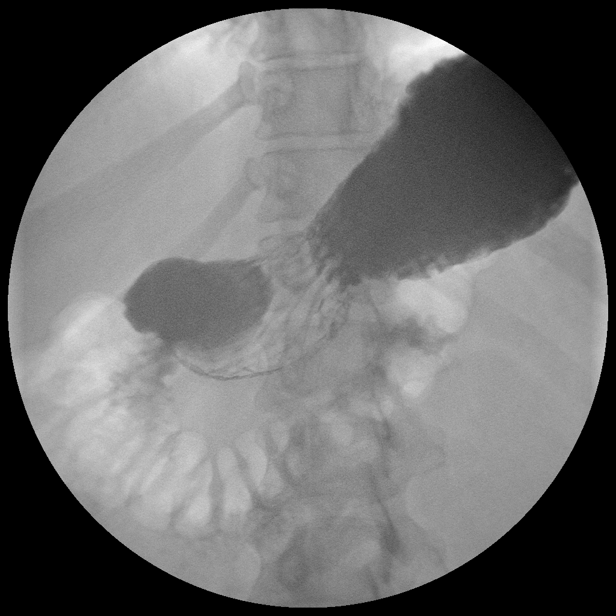

[run (8 of 10)]
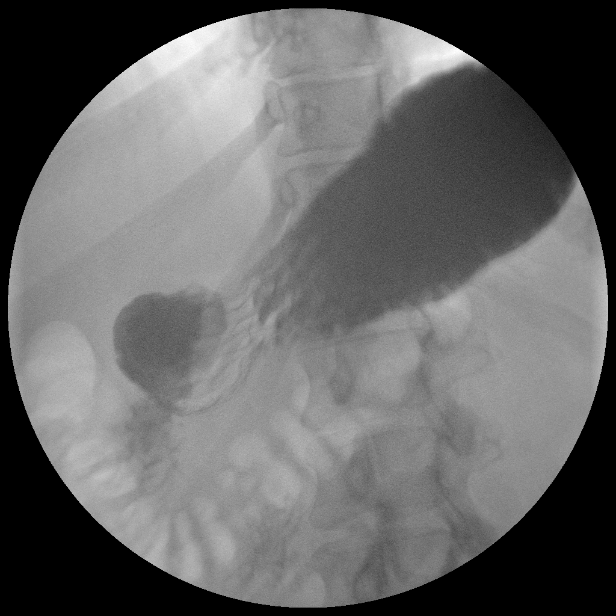

[run (9 of 10)]
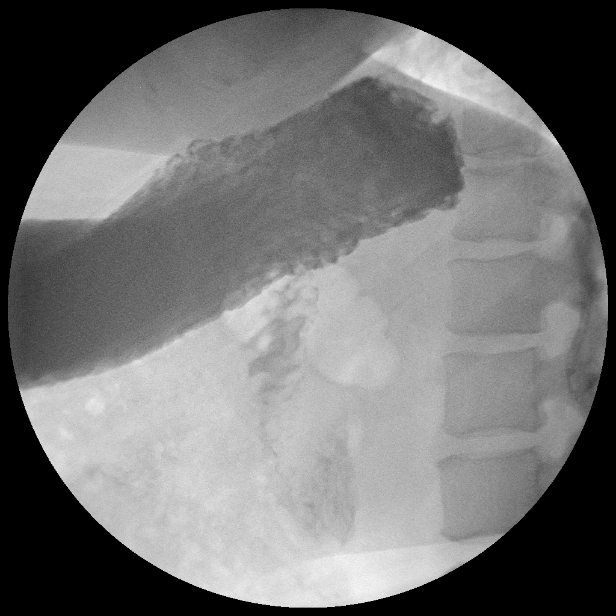

[run (10 of 10)]
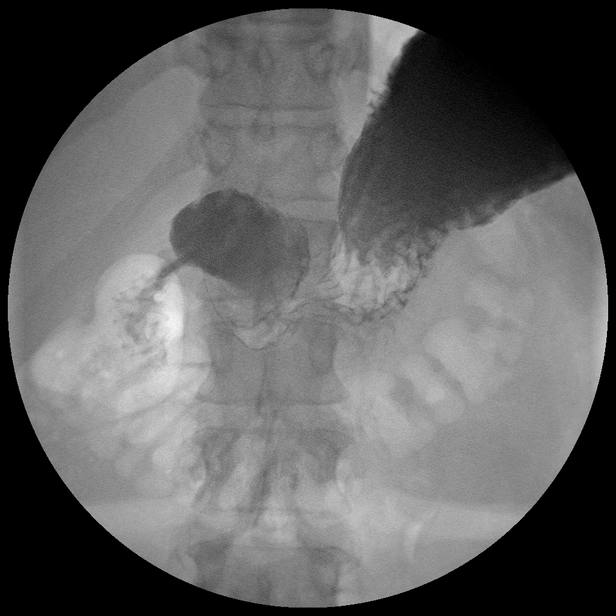

[view not recorded (1 of 5)]
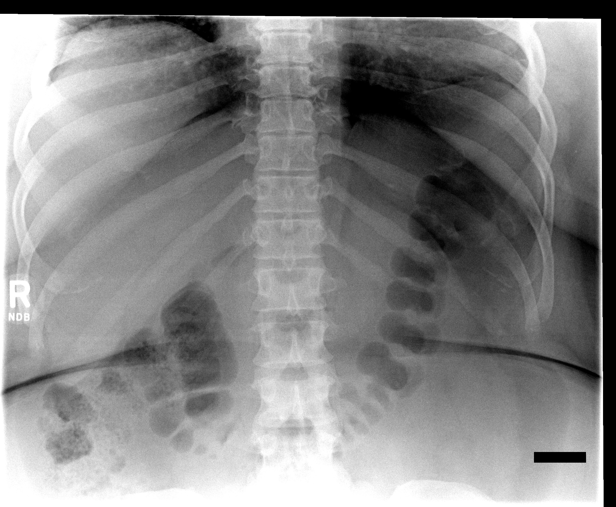

[view not recorded (2 of 5)]
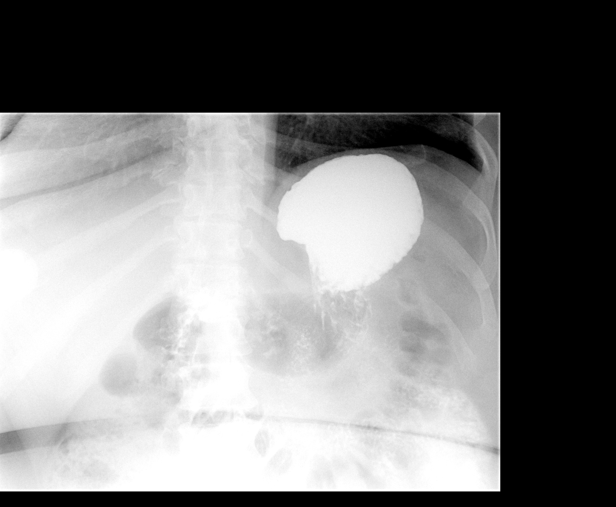

[view not recorded (3 of 5)]
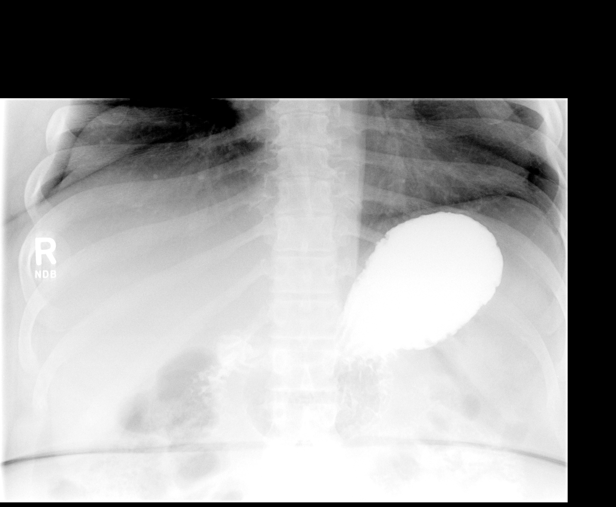

[view not recorded (4 of 5)]
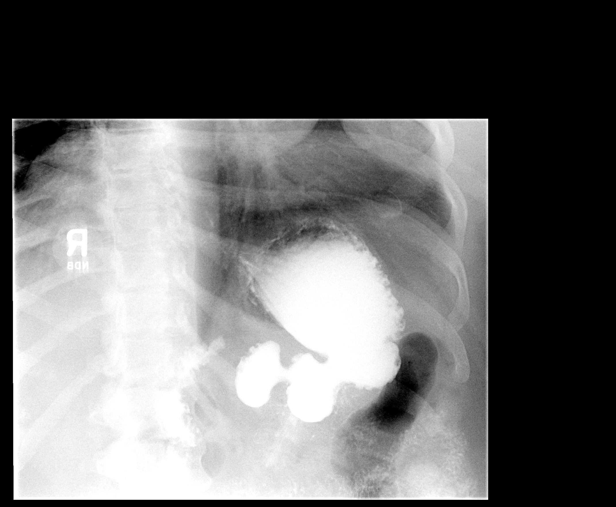

[view not recorded (5 of 5)]
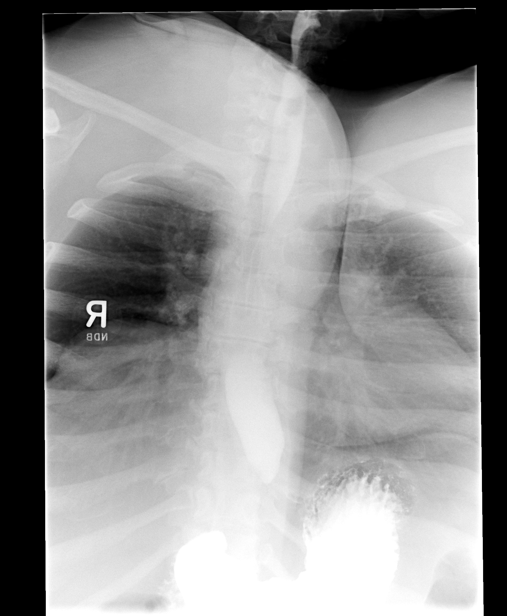

[15 of 21 positions shown; findings below may reference images not displayed]

FINDINGS: KUB: A nonobstructive bowel gas pattern is seen.  No
abnormal calcifications or bony abnormalities are seen.

Upper GI:  The patient was able to swallow barium without
difficulty.  Esophageal mucosa and motility appeared within normal
limits. No gastroesophageal reflux or hiatal hernia was seen.
Gastric mucosa and motility appeared within normal limits.  The
duodenal bulb was well distended has a normal morphology.
Visualized small bowel to the level of the proximal jejunum
appeared normal.
IMPRESSION: Normal upper GI

## 2014-01-13 ENCOUNTER — Ambulatory Visit (INDEPENDENT_AMBULATORY_CARE_PROVIDER_SITE_OTHER): Payer: 59 | Admitting: General Surgery

## 2014-03-04 ENCOUNTER — Other Ambulatory Visit (INDEPENDENT_AMBULATORY_CARE_PROVIDER_SITE_OTHER): Payer: Self-pay

## 2014-03-04 ENCOUNTER — Ambulatory Visit (INDEPENDENT_AMBULATORY_CARE_PROVIDER_SITE_OTHER): Payer: 59 | Admitting: General Surgery

## 2014-03-04 DIAGNOSIS — Z9884 Bariatric surgery status: Secondary | ICD-10-CM

## 2014-03-23 ENCOUNTER — Other Ambulatory Visit: Payer: Self-pay

## 2014-03-23 DIAGNOSIS — Z1231 Encounter for screening mammogram for malignant neoplasm of breast: Secondary | ICD-10-CM

## 2014-03-26 ENCOUNTER — Ambulatory Visit: Payer: 59

## 2014-03-29 ENCOUNTER — Encounter (INDEPENDENT_AMBULATORY_CARE_PROVIDER_SITE_OTHER): Payer: Self-pay | Admitting: General Surgery

## 2014-04-15 ENCOUNTER — Ambulatory Visit
Admission: RE | Admit: 2014-04-15 | Discharge: 2014-04-15 | Disposition: A | Discharge status: Home / Self Care | Payer: 59 | Source: Ambulatory Visit

## 2014-04-15 DIAGNOSIS — Z1231 Encounter for screening mammogram for malignant neoplasm of breast: Secondary | ICD-10-CM

## 2014-05-19 IMAGING — CR DG ABDOMEN ACUTE W/ 1V CHEST
4 series · 4 of 4 positions shown · non-contrast
Comparison: 10/09/2012.

CLINICAL DATA: Abdominal pain. Distention.

EXAM:
ACUTE ABDOMEN SERIES (ABDOMEN 2 VIEW & CHEST 1 VIEW)

[w chest pa]
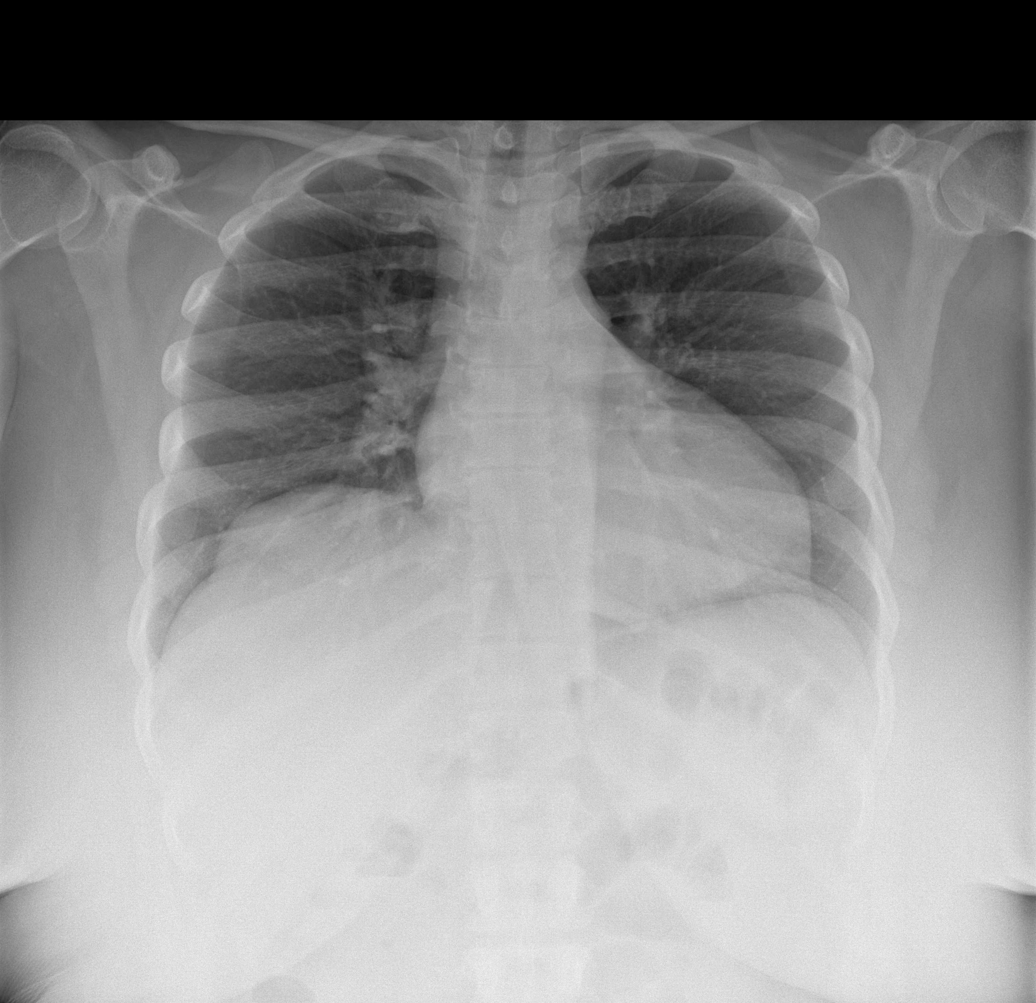

[w abdomen upright]
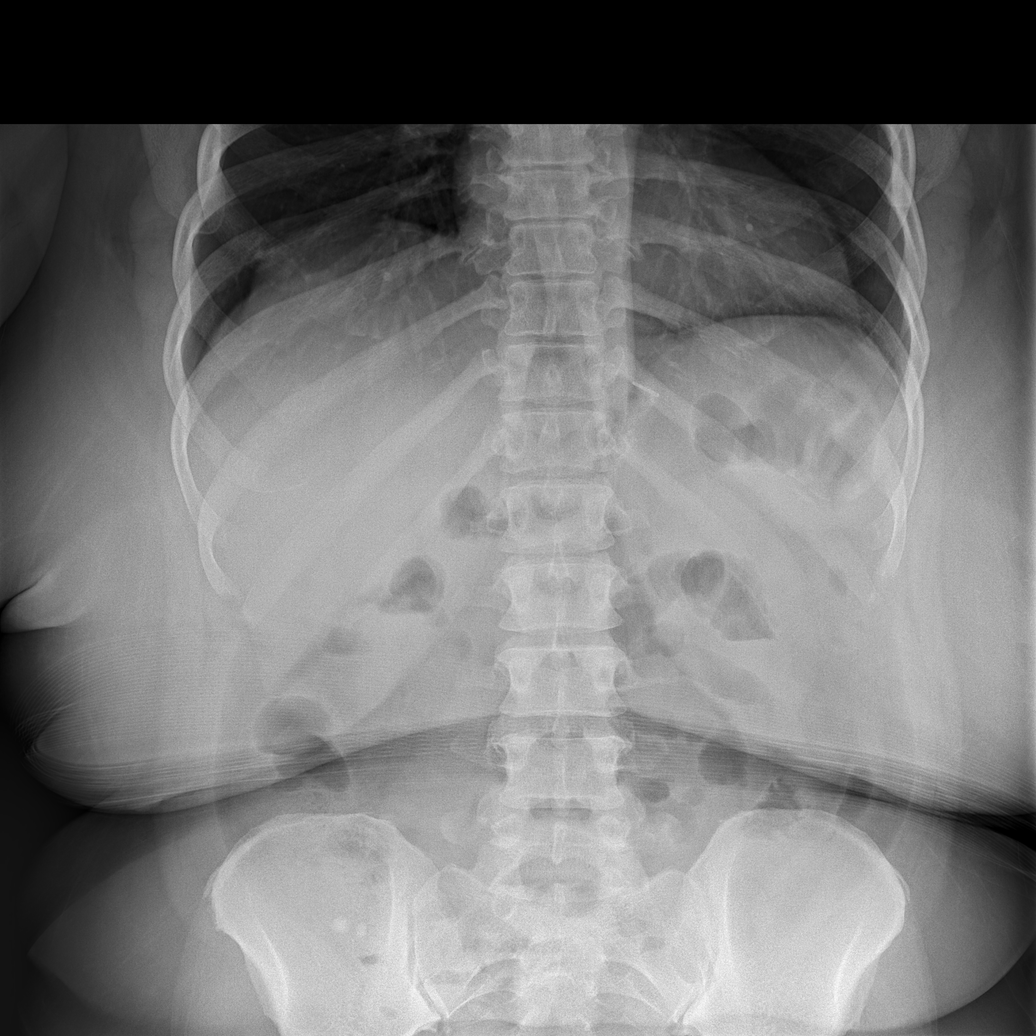

[t abdomen supine (1 of 2)]
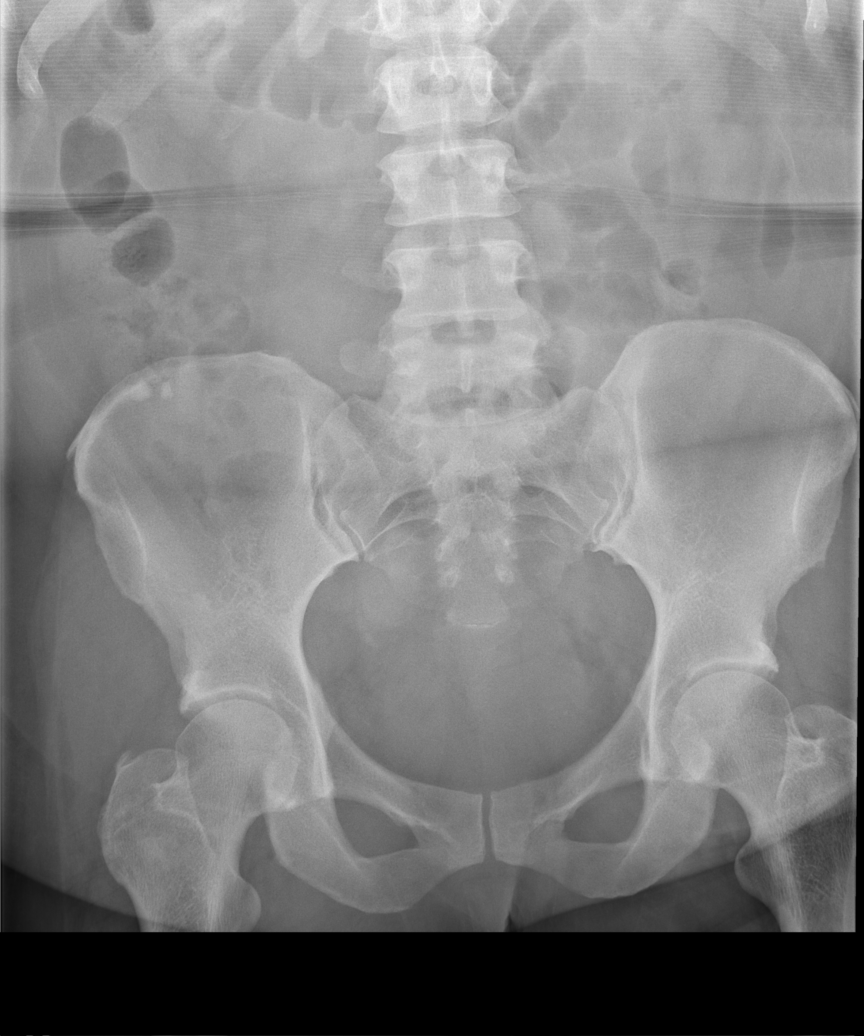

[t abdomen supine (2 of 2)]
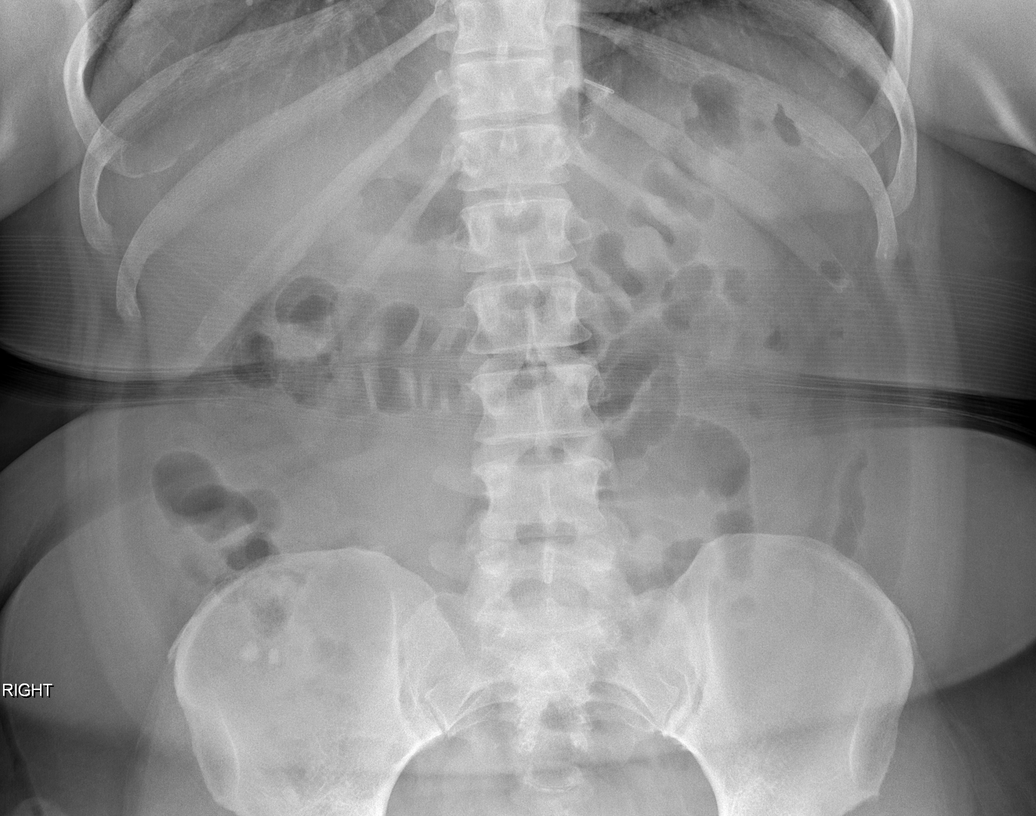

[4 of 4 positions shown; findings below may reference images not displayed]

FINDINGS: No active cardiopulmonary disease or free air underneath the
hemidiaphragms. Gaseous distension of small bowel loops is present
with scattered air-fluid levels. There is no bowel dilation. Paucity
of distal rectal gas. Radiopaque densities are present in the right
lower quadrant near the cecum, likely representing contents in the
enteric stream. Appendicolith is considered unlikely based on the
configuration.
IMPRESSION: Nonobstructive bowel gas pattern with scattered air-fluid levels.
Although nonspecific, this is most commonly associated with enteric
infection.

## 2015-07-25 ENCOUNTER — Encounter (HOSPITAL_BASED_OUTPATIENT_CLINIC_OR_DEPARTMENT_OTHER): Payer: Self-pay | Admitting: Emergency Medicine

## 2015-07-25 ENCOUNTER — Emergency Department (HOSPITAL_BASED_OUTPATIENT_CLINIC_OR_DEPARTMENT_OTHER)
Admission: EM | Admit: 2015-07-25 | Discharge: 2015-07-25 | Disposition: A | Discharge status: Home / Self Care | Payer: Self-pay | Attending: Emergency Medicine | Admitting: Emergency Medicine

## 2015-07-25 DIAGNOSIS — I10 Essential (primary) hypertension: Secondary | ICD-10-CM | POA: Insufficient documentation

## 2015-07-25 DIAGNOSIS — D649 Anemia, unspecified: Secondary | ICD-10-CM | POA: Insufficient documentation

## 2015-07-25 DIAGNOSIS — Z79899 Other long term (current) drug therapy: Secondary | ICD-10-CM | POA: Insufficient documentation

## 2015-07-25 DIAGNOSIS — Z8669 Personal history of other diseases of the nervous system and sense organs: Secondary | ICD-10-CM | POA: Insufficient documentation

## 2015-07-25 DIAGNOSIS — M546 Pain in thoracic spine: Secondary | ICD-10-CM | POA: Insufficient documentation

## 2015-07-25 MED ORDER — DICLOFENAC SODIUM 50 MG PO TBEC: 50.0000 mg | DELAYED_RELEASE_TABLET | Freq: Two times a day (BID) | ORAL | Status: DC

## 2015-07-25 MED ORDER — HYDROCODONE-ACETAMINOPHEN 5-325 MG PO TABS: 2.0000 | ORAL_TABLET | ORAL | Status: DC | PRN

## 2015-07-25 MED ORDER — METHOCARBAMOL 500 MG PO TABS: 500.0000 mg | ORAL_TABLET | Freq: Two times a day (BID) | ORAL | Status: DC

## 2015-07-25 MED FILL — METHOCARBAMOL 500 MG TABLET: 500 | 10 days supply | Qty: 20 | Fill #0

## 2015-07-25 MED FILL — HYDROCODON-APAP 5-325: 5-325 | 2 days supply | Qty: 10 | Fill #0

## 2015-07-25 MED FILL — DICLOFENAC SOD EC 50 MG TAB: 50 | 10 days supply | Qty: 20 | Fill #0

## 2015-07-25 NOTE — Discharge Instructions (Signed)

## 2015-07-25 NOTE — ED Notes (Signed)
Back pain

## 2015-07-25 NOTE — ED Provider Notes (Signed)
CSN: [id number]     Arrival date & time 07/25/15  0830 History   First MD Initiated Contact with Patient 07/25/15 [phone]     Chief Complaint  Patient presents with  . Back Pain     (Consider location/radiation/quality/duration/timing/severity/associated sxs/prior Treatment) Patient is a 45 y.o. female presenting with back pain. The history is provided by the patient. No language interpreter was used.  Back Pain Location:  Thoracic spine Quality:  Aching Radiates to:  Does not radiate Pain severity:  Moderate Pain is:  Worse during the day Onset quality:  Gradual Duration:  1 week Timing:  Constant Progression:  Worsening Chronicity:  New Context: not physical stress and not recent injury   Relieved by:  Nothing Worsened by:  Nothing tried Ineffective treatments:  None tried Associated symptoms: no chest pain and no numbness   Pt has soreness under her left scapula and upper back,  Pt had soreness in neck  Past Medical History  Diagnosis Date  . Hypertension   . Allergy   . Sleep apnea   . Morbid obesity   . Shortness of breath oct 2013    pt given inhaler for use as needed for wheezing and sob-that has helped - no diagnosis of asthma or respiratory problems  . Headache(784.0)     h/a's prior to starting b/p medication - no problems since  . Anemia     pt on iron pills   Past Surgical History  Procedure Laterality Date  . Dilation and curettage of uterus  1990  . Laparoscopic gastric sleeve resection N/A 12/30/2012    Procedure: LAPAROSCOPIC GASTRIC SLEEVE RESECTION;  Surgeon: Edward Jolly, MD;  Location: [facility];  Service: General;  Laterality: N/A;  Laparoscopic Sleeve Gastrectomy   . Esophagogastroduodenoscopy N/A 12/30/2012    Procedure: ESOPHAGOGASTRODUODENOSCOPY (EGD);  Surgeon: Edward Jolly, MD;  Location: [facility];  Service: General;  Laterality: N/A;   Family History  Problem Relation Age of Onset  . Hypertension Brother   . Allergies Brother   .  Pancreatic cancer Maternal Grandmother     pancreatic  . Allergies Sister   . Hypertension Sister   . Breast cancer Maternal Aunt   . Allergies Sister   . Stroke Brother    Social History  Substance Use Topics  . Smoking status: Never Smoker   . Smokeless tobacco: Never Used  . Alcohol Use: Yes     Comment: Rare   OB History    Gravida Para Term Preterm AB TAB SAB Ectopic Multiple Living   1         0     Review of Systems  Cardiovascular: Negative for chest pain.  Musculoskeletal: Positive for back pain.  Neurological: Negative for numbness.  All other systems reviewed and are negative.     Allergies  Augmentin and Azithromycin  Home Medications   Prior to Admission medications   Medication Sig Start Date End Date Taking? Authorizing Provider  albuterol (PROVENTIL HFA;VENTOLIN HFA) 108 (90 BASE) MCG/ACT inhaler Inhale 2 puffs into the lungs every 6 (six) hours as needed for wheezing.    Historical Provider, MD  B-12, METHYLCOBALAMIN, SL Place under the tongue.    Historical Provider, MD  Biotin 2.5 MG CAPS Take by mouth.    Historical Provider, MD  Calcium Carbonate-Vitamin D (CALCIUM + D PO) Take by mouth.    Historical Provider, MD  cetirizine (ZYRTEC) 5 MG tablet Take 5 mg by mouth daily.    Historical  Provider, MD  Fe Fum-FePoly-Vit C-Vit B3 (INTEGRA) 62.5-62.5-40-3 MG CAPS Take 1 capsule by mouth daily. Take one capsule daily.  Generic OK 08/03/13   Lanice Shirts, MD  MULTIPLE VITAMIN PO Take by mouth.    Historical Provider, MD  omeprazole (PRILOSEC) 20 MG capsule Take 20 mg by mouth daily.    Historical Provider, MD  polyethylene glycol (MIRALAX / GLYCOLAX) packet Take 17 g by mouth daily as needed (for constipation).    Historical Provider, MD   BP 143/102 mmHg  Pulse 59  Temp(Src) 98.3 F (36.8 C) (Oral)  Resp 18  Ht 5\' 2"  (1.575 m)  Wt 89.812 kg  BMI 36.21 kg/m2  SpO2 100% Physical Exam  Constitutional: She appears well-developed and  well-nourished.  HENT:  Head: Normocephalic.  Right Ear: External ear normal.  Nose: Nose normal.  Mouth/Throat: Oropharynx is clear and moist.  Eyes: Conjunctivae and EOM are normal. Pupils are equal, round, and reactive to light.  Neck: Normal range of motion.  Cardiovascular: Normal rate.   Pulmonary/Chest: Effort normal and breath sounds normal.  Abdominal: Soft.  Musculoskeletal: Normal range of motion.  Neurological: She is alert.  Skin: Skin is warm.  Psychiatric: She has a normal mood and affect.  Nursing note and vitals reviewed.   ED Course  Procedures (including critical care time) Labs Review Labs Reviewed - No data to display  Imaging Review No results found. I have personally reviewed and evaluated these images and lab results as part of my medical decision-making.   EKG Interpretation None      MDM  Pt advised to follow up with Dr. Judene Companion.  I suspect muscle spasm.     Final diagnoses:  Right-sided thoracic back pain    An After Visit Summary was printed and given to the patient.  Meds ordered this encounter  Medications  . diclofenac (VOLTAREN) 50 MG EC tablet    Sig: Take 1 tablet (50 mg total) by mouth 2 (two) times daily.    Dispense:  20 tablet    Refill:  0    Order Specific Question:  Supervising Provider    Answer:  MILLER, BRIAN [[id number]]  . methocarbamol (ROBAXIN) 500 MG tablet    Sig: Take 1 tablet (500 mg total) by mouth 2 (two) times daily.    Dispense:  20 tablet    Refill:  0    Order Specific Question:  Supervising Provider    Answer:  MILLER, BRIAN [[id number]]  . HYDROcodone-acetaminophen (NORCO/VICODIN) 5-325 MG tablet    Sig: Take 2 tablets by mouth every 4 (four) hours as needed.    Dispense:  10 tablet    Refill:  0    Order Specific Question:  Supervising Provider    Answer:  Noemi Chapel [place], PA-C 07/25/15 [place], MD 07/25/15 [phone]

## 2015-07-28 ENCOUNTER — Ambulatory Visit (HOSPITAL_COMMUNITY)
Admission: RE | Admit: 2015-07-28 | Discharge: 2015-07-28 | Disposition: A | Discharge status: Home / Self Care | Payer: 59 | Source: Ambulatory Visit | Attending: General Surgery | Admitting: General Surgery

## 2015-07-28 ENCOUNTER — Other Ambulatory Visit (HOSPITAL_COMMUNITY): Payer: Self-pay | Admitting: General Surgery

## 2015-07-28 DIAGNOSIS — K8020 Calculus of gallbladder without cholecystitis without obstruction: Secondary | ICD-10-CM | POA: Insufficient documentation

## 2015-07-28 DIAGNOSIS — R109 Unspecified abdominal pain: Secondary | ICD-10-CM | POA: Insufficient documentation

## 2015-08-01 ENCOUNTER — Encounter (HOSPITAL_BASED_OUTPATIENT_CLINIC_OR_DEPARTMENT_OTHER): Payer: Self-pay | Admitting: *Deleted

## 2015-08-01 NOTE — H&P (Signed)
  History of Present Illness Marland Kitchen T. Kyleena Scheirer MD; 07/29/2015 9:30 AM) Patient words: gallbladder problems.  The patient is a 45 year old female who presents for evaluation of gall stones. She is well known to me status post sleeve gastrectomy for morbid obesity. She says that for several weeks she has had intermittent pain in her right shoulder. Then for about past week this has then down more in her right flank and right subcostal area. Worse over the last several days. It has not been constant but there most of the time. Definitely worse with eating and she has had nausea and one episode of vomiting. She felt feverish but no documented fever or chills. No jaundice. She presented to the emergency department 2 days ago and was treated symptomatically. She called describing her symptoms and requesting a gallbladder ultrasound which we obtained. This has revealed multiple shadowing foci up to 12 mm consistent with gallstones. No evidence of cholecystitis. Common bile duct is normal at 4 mm. No other GI symptoms. No urinary symptoms.   Allergies Patsey Berthold, CMA; 07/29/2015 9:04 AM) Augmentin *PENICILLINS* Azithromycin *CHEMICALS*  Medication History Patsey Berthold, CMA; 07/29/2015 9:04 AM) Medications Reconciled  Vitals Patsey Berthold CMA; 07/29/2015 9:04 AM) 07/29/2015 9:04 AM Weight: 197.6 lb Height: 62in Body Surface Area: 1.9 m Body Mass Index: 36.14 kg/m  Temp.: 98.57F  Pulse: 70 (Regular)  BP: 116/78 (Sitting, Left Arm, Standard)       Physical Exam Marland Kitchen T. Jmya Uliano MD; 07/29/2015 9:31 AM) The physical exam findings are as follows: Note:General: Alert, moderately obese Afro-American female, in no distress Skin: Warm and dry without rash or infection. HEENT: No palpable masses or thyromegaly. Sclera nonicteric. Pupils equal round and reactive.. Lungs: Breath sounds clear and equal. No wheezing or increased work of breathing. Cardiovascular: Regular  rate and rhythm without murmer. No JVD or edema. Peripheral pulses intact. No carotid bruits. Abdomen: Nondistended. Some tenderness in the right upper quadrant without guarding.. No masses palpable. No organomegaly. No palpable hernias. Extremities: No edema or joint swelling or deformity. No chronic venous stasis changes. Neurologic: Alert and fully oriented. Gait normal. No focal weakness. Psychiatric: Normal mood and affect. Thought content appropriate with normal judgement and insight    Assessment & Plan Marland Kitchen T. Patton Rabinovich MD; 07/29/2015 9:32 AM) GALLSTONES (K80.20) Impression: Persistent and worsening right flank and epigastric pain, nausea, consistent with biliary tract disease. Gallbladder ultrasound shows multiple stones. Also localized tenderness in the right upper quadrant. She appears to have symptomatic cholelithiasis. I discussed the diagnosis with her and recommended proceeding with laparoscopic cholecystectomy in order to relieve her symptoms and prevent future complications. I discussed the procedure in detail. The patient was given Neurosurgeon. We discussed the risks and benefits of a laparoscopic cholecystectomy and possible cholangiogram including, but not limited to, bleeding, infection, injury to surrounding structures such as the intestine or liver, bile leak, retained gallstones, need to convert to an open procedure, prolonged diarrhea, blood clots such as DVT, common bile duct injury, anesthesia risks, and possible need for additional procedures. The likelihood of improvement in symptoms and return to the patient's normal status is good. We discussed the typical post-operative recovery course. All questions were answered. Current Plans Schedule for Surgery  Laparoscopic cholecystectomy with intraoperative cholangiogram as an outpatient under general anesthesia  Pt Education - Pamphlet Given - Laparoscopic Gallbladder Surgery: discussed with patient and provided  information.

## 2015-08-02 ENCOUNTER — Ambulatory Visit (HOSPITAL_BASED_OUTPATIENT_CLINIC_OR_DEPARTMENT_OTHER): Payer: 59 | Admitting: Certified Registered"

## 2015-08-02 ENCOUNTER — Encounter (HOSPITAL_BASED_OUTPATIENT_CLINIC_OR_DEPARTMENT_OTHER): Payer: Self-pay

## 2015-08-02 ENCOUNTER — Ambulatory Visit (HOSPITAL_COMMUNITY): Payer: 59

## 2015-08-02 ENCOUNTER — Encounter (HOSPITAL_BASED_OUTPATIENT_CLINIC_OR_DEPARTMENT_OTHER)
Admission: RE | Disposition: A | Discharge status: Home / Self Care | Payer: Self-pay | Source: Ambulatory Visit | Attending: General Surgery

## 2015-08-02 ENCOUNTER — Ambulatory Visit (HOSPITAL_BASED_OUTPATIENT_CLINIC_OR_DEPARTMENT_OTHER)
Admission: RE | Admit: 2015-08-02 | Discharge: 2015-08-03 | Disposition: A | Discharge status: Home / Self Care | Payer: 59 | Source: Ambulatory Visit | Attending: General Surgery | Admitting: General Surgery

## 2015-08-02 DIAGNOSIS — Z6836 Body mass index (BMI) 36.0-36.9, adult: Secondary | ICD-10-CM | POA: Insufficient documentation

## 2015-08-02 DIAGNOSIS — G4730 Sleep apnea, unspecified: Secondary | ICD-10-CM | POA: Insufficient documentation

## 2015-08-02 DIAGNOSIS — K8012 Calculus of gallbladder with acute and chronic cholecystitis without obstruction: Secondary | ICD-10-CM | POA: Diagnosis not present

## 2015-08-02 DIAGNOSIS — K8020 Calculus of gallbladder without cholecystitis without obstruction: Secondary | ICD-10-CM

## 2015-08-02 DIAGNOSIS — I10 Essential (primary) hypertension: Secondary | ICD-10-CM | POA: Diagnosis not present

## 2015-08-02 DIAGNOSIS — K219 Gastro-esophageal reflux disease without esophagitis: Secondary | ICD-10-CM | POA: Diagnosis not present

## 2015-08-02 DIAGNOSIS — K8010 Calculus of gallbladder with chronic cholecystitis without obstruction: Secondary | ICD-10-CM | POA: Diagnosis present

## 2015-08-02 HISTORY — DX: Gastro-esophageal reflux disease without esophagitis: K21.9

## 2015-08-02 HISTORY — PX: CHOLECYSTECTOMY: SHX55

## 2015-08-02 LAB — POCT HEMOGLOBIN-HEMACUE: Hemoglobin: 11 g/dL — ABNORMAL LOW (ref 12.0–15.0)

## 2015-08-02 SURGERY — LAPAROSCOPIC CHOLECYSTECTOMY WITH INTRAOPERATIVE CHOLANGIOGRAM
Anesthesia: General | Site: Abdomen

## 2015-08-02 MED ORDER — HYDROMORPHONE HCL 1 MG/ML IJ SOLN
0.2500 mg | INTRAMUSCULAR | Status: DC | PRN
  Administered 2015-08-02 (×3): 0.5 mg via INTRAVENOUS

## 2015-08-02 MED ORDER — ONDANSETRON HCL 4 MG/2ML IJ SOLN
INTRAMUSCULAR | Status: AC
  Filled 2015-08-02: qty 2

## 2015-08-02 MED ORDER — KCL IN DEXTROSE-NACL 20-5-0.9 MEQ/L-%-% IV SOLN
INTRAVENOUS | Status: DC
  Administered 2015-08-02: 17:00:00 via INTRAVENOUS

## 2015-08-02 MED ORDER — GLYCOPYRROLATE 0.2 MG/ML IJ SOLN
INTRAMUSCULAR | Status: AC
  Filled 2015-08-02: qty 1

## 2015-08-02 MED ORDER — ROCURONIUM BROMIDE 100 MG/10ML IV SOLN
INTRAVENOUS | Status: DC | PRN
  Administered 2015-08-02: 50 mg via INTRAVENOUS

## 2015-08-02 MED ORDER — SODIUM CHLORIDE 0.9 % IV SOLN
INTRAVENOUS | Status: DC | PRN
  Administered 2015-08-02: 14 mL

## 2015-08-02 MED ORDER — FENTANYL CITRATE (PF) 100 MCG/2ML IJ SOLN
INTRAMUSCULAR | Status: AC
  Filled 2015-08-02: qty 2

## 2015-08-02 MED ORDER — FENTANYL CITRATE (PF) 100 MCG/2ML IJ SOLN
50.0000 ug | INTRAMUSCULAR | Status: AC | PRN
  Administered 2015-08-02: 100 ug via INTRAVENOUS
  Administered 2015-08-02 (×2): 50 ug via INTRAVENOUS

## 2015-08-02 MED ORDER — CIPROFLOXACIN IN D5W 400 MG/200ML IV SOLN
INTRAVENOUS | Status: AC
  Filled 2015-08-02: qty 200

## 2015-08-02 MED ORDER — OXYCODONE HCL 5 MG/5ML PO SOLN: 5.0000 mg | Freq: Once | ORAL | Status: DC | PRN

## 2015-08-02 MED ORDER — HYDROMORPHONE HCL 1 MG/ML IJ SOLN
INTRAMUSCULAR | Status: AC
  Filled 2015-08-02: qty 1

## 2015-08-02 MED ORDER — OXYCODONE HCL 5 MG PO TABS: 5.0000 mg | ORAL_TABLET | Freq: Once | ORAL | Status: DC | PRN

## 2015-08-02 MED ORDER — MIDAZOLAM HCL 2 MG/2ML IJ SOLN
INTRAMUSCULAR | Status: AC
  Filled 2015-08-02: qty 2

## 2015-08-02 MED ORDER — ONDANSETRON HCL 4 MG/2ML IJ SOLN
INTRAMUSCULAR | Status: DC | PRN
  Administered 2015-08-02: 4 mg via INTRAVENOUS

## 2015-08-02 MED ORDER — PROPOFOL 10 MG/ML IV BOLUS
INTRAVENOUS | Status: DC | PRN
  Administered 2015-08-02: 200 mg via INTRAVENOUS

## 2015-08-02 MED ORDER — SCOPOLAMINE 1 MG/3DAYS TD PT72: 1.0000 | MEDICATED_PATCH | Freq: Once | TRANSDERMAL | Status: DC | PRN

## 2015-08-02 MED ORDER — ENOXAPARIN SODIUM 40 MG/0.4ML ~~LOC~~ SOLN: 40.0000 mg | SUBCUTANEOUS | Status: DC

## 2015-08-02 MED ORDER — LIDOCAINE HCL (CARDIAC) 20 MG/ML IV SOLN
INTRAVENOUS | Status: DC | PRN
  Administered 2015-08-02: 50 mg via INTRAVENOUS

## 2015-08-02 MED ORDER — KETOROLAC TROMETHAMINE 30 MG/ML IJ SOLN
INTRAMUSCULAR | Status: DC | PRN
  Administered 2015-08-02: 30 mg via INTRAVENOUS

## 2015-08-02 MED ORDER — SODIUM CHLORIDE 0.9 % IR SOLN
Status: DC | PRN
  Administered 2015-08-02: 1

## 2015-08-02 MED ORDER — LACTATED RINGERS IV SOLN
INTRAVENOUS | Status: DC
  Administered 2015-08-02 (×2): via INTRAVENOUS

## 2015-08-02 MED ORDER — MEPERIDINE HCL 25 MG/ML IJ SOLN: 6.2500 mg | INTRAMUSCULAR | Status: DC | PRN

## 2015-08-02 MED ORDER — DEXAMETHASONE SODIUM PHOSPHATE 10 MG/ML IJ SOLN
INTRAMUSCULAR | Status: AC
  Filled 2015-08-02: qty 1

## 2015-08-02 MED ORDER — CIPROFLOXACIN IN D5W 400 MG/200ML IV SOLN
INTRAVENOUS | Status: DC | PRN
  Administered 2015-08-02: 400 mg via INTRAVENOUS

## 2015-08-02 MED ORDER — MORPHINE SULFATE (PF) 2 MG/ML IV SOLN
2.0000 mg | INTRAVENOUS | Status: DC | PRN
  Administered 2015-08-02: 2 mg via INTRAVENOUS
  Filled 2015-08-02: qty 1

## 2015-08-02 MED ORDER — PANTOPRAZOLE SODIUM 40 MG PO TBEC
40.0000 mg | DELAYED_RELEASE_TABLET | Freq: Every day | ORAL | Status: DC
  Administered 2015-08-02: 40 mg via ORAL
  Filled 2015-08-02: qty 1

## 2015-08-02 MED ORDER — PROPOFOL 10 MG/ML IV BOLUS
INTRAVENOUS | Status: AC
  Filled 2015-08-02: qty 20

## 2015-08-02 MED ORDER — BUPIVACAINE-EPINEPHRINE 0.25% -1:200000 IJ SOLN
INTRAMUSCULAR | Status: DC | PRN
  Administered 2015-08-02: 20 mL

## 2015-08-02 MED ORDER — KETOROLAC TROMETHAMINE 30 MG/ML IJ SOLN
INTRAMUSCULAR | Status: AC
  Filled 2015-08-02: qty 1

## 2015-08-02 MED ORDER — ONDANSETRON HCL 4 MG/2ML IJ SOLN: 4.0000 mg | Freq: Four times a day (QID) | INTRAMUSCULAR | Status: DC | PRN

## 2015-08-02 MED ORDER — LIDOCAINE HCL (CARDIAC) 20 MG/ML IV SOLN
INTRAVENOUS | Status: AC
  Filled 2015-08-02: qty 5

## 2015-08-02 MED ORDER — SUGAMMADEX SODIUM 200 MG/2ML IV SOLN
INTRAVENOUS | Status: DC | PRN
  Administered 2015-08-02: 200 mg via INTRAVENOUS

## 2015-08-02 MED ORDER — OXYCODONE-ACETAMINOPHEN 5-325 MG PO TABS
1.0000 | ORAL_TABLET | ORAL | Status: DC | PRN
  Administered 2015-08-02: 2 via ORAL
  Filled 2015-08-02: qty 2

## 2015-08-02 MED ORDER — SUGAMMADEX SODIUM 200 MG/2ML IV SOLN
INTRAVENOUS | Status: AC
  Filled 2015-08-02: qty 2

## 2015-08-02 MED ORDER — GLYCOPYRROLATE 0.2 MG/ML IJ SOLN: 0.2000 mg | Freq: Once | INTRAMUSCULAR | Status: DC | PRN

## 2015-08-02 MED ORDER — ONDANSETRON 4 MG PO TBDP: 4.0000 mg | ORAL_TABLET | Freq: Four times a day (QID) | ORAL | Status: DC | PRN

## 2015-08-02 MED ORDER — BUPIVACAINE-EPINEPHRINE (PF) 0.25% -1:200000 IJ SOLN
INTRAMUSCULAR | Status: AC
  Filled 2015-08-02: qty 30

## 2015-08-02 MED ORDER — DEXAMETHASONE SODIUM PHOSPHATE 4 MG/ML IJ SOLN
INTRAMUSCULAR | Status: DC | PRN
  Administered 2015-08-02: 10 mg via INTRAVENOUS

## 2015-08-02 MED ORDER — MIDAZOLAM HCL 2 MG/2ML IJ SOLN
1.0000 mg | INTRAMUSCULAR | Status: DC | PRN
  Administered 2015-08-02: 2 mg via INTRAVENOUS

## 2015-08-02 SURGICAL SUPPLY — 49 items
APPLIER CLIP ROT 10 11.4 M/L (STAPLE) ×2
APR CLP MED LRG 11.4X10 (STAPLE) ×1
BAG SPEC RTRVL LRG 6X4 10 (ENDOMECHANICALS) ×1
BIOPATCH RED 1 DISK 7.0 (GAUZE/BANDAGES/DRESSINGS) ×1 IMPLANT
BLADE CLIPPER SURG (BLADE) IMPLANT
CANISTER SUCT 1200ML W/VALVE (MISCELLANEOUS) ×2 IMPLANT
CHLORAPREP W/TINT 26ML (MISCELLANEOUS) ×2 IMPLANT
CLIP APPLIE ROT 10 11.4 M/L (STAPLE) ×1 IMPLANT
COVER MAYO STAND STRL (DRAPES) ×2 IMPLANT
DECANTER SPIKE VIAL GLASS SM (MISCELLANEOUS) IMPLANT
DRAIN CHANNEL 19F RND (DRAIN) ×1 IMPLANT
DRAPE C-ARM 42X72 X-RAY (DRAPES) ×2 IMPLANT
DRAPE LAPAROSCOPIC ABDOMINAL (DRAPES) ×2 IMPLANT
DRSG TEGADERM 2-3/8X2-3/4 SM (GAUZE/BANDAGES/DRESSINGS) ×1 IMPLANT
ELECT REM PT RETURN 9FT ADLT (ELECTROSURGICAL) ×2
ELECTRODE REM PT RTRN 9FT ADLT (ELECTROSURGICAL) ×1 IMPLANT
EVACUATOR SILICONE 100CC (DRAIN) ×1 IMPLANT
FILTER SMOKE EVAC LAPAROSHD (FILTER) IMPLANT
GLOVE BIOGEL PI IND STRL 7.0 (GLOVE) IMPLANT
GLOVE BIOGEL PI IND STRL 8 (GLOVE) ×1 IMPLANT
GLOVE BIOGEL PI INDICATOR 7.0 (GLOVE) ×3
GLOVE BIOGEL PI INDICATOR 8 (GLOVE) ×1
GLOVE ECLIPSE 6.5 STRL STRAW (GLOVE) ×2 IMPLANT
GLOVE ECLIPSE 7.5 STRL STRAW (GLOVE) ×1 IMPLANT
GLOVE SURG SS PI 7.0 STRL IVOR (GLOVE) ×1 IMPLANT
GOWN STRL REUS W/ TWL LRG LVL3 (GOWN DISPOSABLE) ×3 IMPLANT
GOWN STRL REUS W/TWL LRG LVL3 (GOWN DISPOSABLE) ×8
HEMOSTAT SNOW SURGICEL 2X4 (HEMOSTASIS) IMPLANT
LINER CANISTER 1000CC FLEX (MISCELLANEOUS) ×2 IMPLANT
LIQUID BAND (GAUZE/BANDAGES/DRESSINGS) ×2 IMPLANT
NS IRRIG 1000ML POUR BTL (IV SOLUTION) IMPLANT
PACK BASIN DAY SURGERY FS (CUSTOM PROCEDURE TRAY) ×2 IMPLANT
POUCH SPECIMEN RETRIEVAL 10MM (ENDOMECHANICALS) ×2 IMPLANT
SCISSORS LAP 5X35 DISP (ENDOMECHANICALS) ×2 IMPLANT
SET CHOLANGIOGRAPH 5 50 .035 (SET/KITS/TRAYS/PACK) ×2 IMPLANT
SET IRRIG TUBING LAPAROSCOPIC (IRRIGATION / IRRIGATOR) ×2 IMPLANT
SLEEVE ENDOPATH XCEL 5M (ENDOMECHANICALS) ×2 IMPLANT
SLEEVE SCD COMPRESS KNEE MED (MISCELLANEOUS) ×2 IMPLANT
STRIP CLOSURE SKIN 1/2X4 (GAUZE/BANDAGES/DRESSINGS) IMPLANT
SUT ETHILON 3 0 PS 1 (SUTURE) ×1 IMPLANT
SUT MNCRL AB 4-0 PS2 18 (SUTURE) ×2 IMPLANT
SUT VICRYL 0 UR6 27IN ABS (SUTURE) IMPLANT
TOWEL OR 17X24 6PK STRL BLUE (TOWEL DISPOSABLE) ×2 IMPLANT
TRAY LAPAROSCOPIC (CUSTOM PROCEDURE TRAY) ×2 IMPLANT
TROCAR XCEL BLUNT TIP 100MML (ENDOMECHANICALS) ×2 IMPLANT
TROCAR XCEL NON-BLD 11X100MML (ENDOMECHANICALS) ×2 IMPLANT
TROCAR XCEL NON-BLD 5MMX100MML (ENDOMECHANICALS) ×2 IMPLANT
TUBE CONNECTING 20X1/4 (TUBING) ×2 IMPLANT
TUBING INSUFFLATION (TUBING) ×2 IMPLANT

## 2015-08-02 NOTE — Anesthesia Preprocedure Evaluation (Signed)
Anesthesia Evaluation  Patient identified by MRN, date of birth, ID band Patient awake    Reviewed: Allergy & Precautions, NPO status , Patient's Chart, lab work & pertinent test results  Airway Mallampati: I  TM Distance: >3 FB Neck ROM: Full    Dental  (+) Teeth Intact, Dental Advisory Given   Pulmonary    breath sounds clear to auscultation       Cardiovascular hypertension, Pt. on medications  Rhythm:Regular Rate:Normal     Neuro/Psych    GI/Hepatic GERD  Medicated and Controlled,  Endo/Other  Morbid obesity  Renal/GU      Musculoskeletal   Abdominal   Peds  Hematology   Anesthesia Other Findings Pt stopped using her CPAP a year ago.  Has not been retested since her weight loss.  I explained she needs to stay overnight in [place] since her GA makes her unsafe to go home today without knowing the status of her OSA.  Reproductive/Obstetrics                             Anesthesia Physical Anesthesia Plan  ASA: III  Anesthesia Plan: General   Post-op Pain Management:    Induction: Intravenous  Airway Management Planned: Oral ETT  Additional Equipment:   Intra-op Plan:   Post-operative Plan: Extubation in OR  Informed Consent: I have reviewed the patients History and Physical, chart, labs and discussed the procedure including the risks, benefits and alternatives for the proposed anesthesia with the patient or authorized representative who has indicated his/her understanding and acceptance.   Dental advisory given  Plan Discussed with: CRNA, Anesthesiologist and Surgeon  Anesthesia Plan Comments:         Anesthesia Quick Evaluation

## 2015-08-02 NOTE — Anesthesia Postprocedure Evaluation (Signed)
Anesthesia Post Note  Patient: Robin Cox  Procedure(s) Performed: Procedure(s) (LRB): LAPAROSCOPIC CHOLECYSTECTOMY WITH INTRAOPERATIVE CHOLANGIOGRAM (N/A)  Patient location during evaluation: PACU Anesthesia Type: General Level of consciousness: awake and alert Pain management: pain level controlled Vital Signs Assessment: post-procedure vital signs reviewed and stable Respiratory status: spontaneous breathing, nonlabored ventilation, respiratory function stable and patient connected to face mask oxygen Cardiovascular status: blood pressure returned to baseline and stable Postop Assessment: no signs of nausea or vomiting Anesthetic complications: no    Last Vitals:  Filed Vitals:   08/02/15 1530 08/02/15 1545  BP: 142/88 138/101  Pulse: 50 52  Temp:    Resp: 16 18    Last Pain:  Filed Vitals:   08/02/15 1547  PainSc: 6                  Khalfani Weideman A

## 2015-08-02 NOTE — Transfer of Care (Signed)
Immediate Anesthesia Transfer of Care Note  Patient: Robin Cox  Procedure(s) Performed: Procedure(s): LAPAROSCOPIC CHOLECYSTECTOMY WITH INTRAOPERATIVE CHOLANGIOGRAM (N/A)  Patient Location: PACU  Anesthesia Type:General  Level of Consciousness: sedated  Airway & Oxygen Therapy: Patient Spontanous Breathing and Patient connected to face mask oxygen  Post-op Assessment: Report given to RN and Post -op Vital signs reviewed and stable  Post vital signs: Reviewed and stable  Last Vitals:  Filed Vitals:   08/02/15 1058  BP: 125/62  Pulse: 64  Temp: 36.9 C  Resp: 18    Complications: No apparent anesthesia complications

## 2015-08-02 NOTE — Anesthesia Procedure Notes (Signed)
Procedure Name: Intubation Date/Time: 08/02/2015 12:29 PM Performed by: Lieutenant Diego Pre-anesthesia Checklist: Patient identified, Emergency Drugs available, Suction available and Patient being monitored Patient Re-evaluated:Patient Re-evaluated prior to inductionOxygen Delivery Method: Circle System Utilized Preoxygenation: Pre-oxygenation with 100% oxygen Intubation Type: IV induction Ventilation: Mask ventilation without difficulty Laryngoscope Size: Miller and 2 Grade View: Grade I Tube type: Oral Number of attempts: 1 Airway Equipment and Method: Stylet and Oral airway Placement Confirmation: ETT inserted through vocal cords under direct vision,  positive ETCO2 and breath sounds checked- equal and bilateral Secured at: 22 cm Tube secured with: Tape Dental Injury: Teeth and Oropharynx as per pre-operative assessment

## 2015-08-02 NOTE — Interval H&P Note (Signed)
History and Physical Interval Note:  08/02/2015 12:01 PM  Robin Cox  has presented today for surgery, with the diagnosis of Cholelithiasis  The various methods of treatment have been discussed with the patient and family. After consideration of risks, benefits and other options for treatment, the patient has consented to  Procedure(s): LAPAROSCOPIC CHOLECYSTECTOMY WITH INTRAOPERATIVE CHOLANGIOGRAM (N/A) as a surgical intervention .  The patient's history has been reviewed, patient examined, no change in status, stable for surgery.  I have reviewed the patient's chart and labs.  Questions were answered to the patient's satisfaction.     Quinnlan Abruzzo T

## 2015-08-02 NOTE — Op Note (Signed)
Preoperative Diagnosis: Cholelithiasis And cholecystitis  Postoprative Diagnosis: Cholelithiasis And severe chronic and subacute cholecystitis  Procedure: Procedure(s): LAPAROSCOPIC CHOLECYSTECTOMY WITH INTRAOPERATIVE CHOLANGIOGRAM   Surgeon: Excell Seltzer T   Assistants: None  Anesthesia:  General endotracheal anesthesia  Indications: Patient is a 45 year old female with weight loss following sleeve gastrectomy who has developed frequent worsening severe epigastric and right upper quadrant abdominal pain with nausea postprandially. Workup has included a gallbladder ultrasound showing gallstones but no evidence of acute cholecystitis or dilated common bile duct. I recommended proceeding with laparoscopic cholecystectomy with cholangiogram in an effort to relieve her symptoms and prevent future complications from her gallbladder. The nature of the surgery and indications and risks have been discussed extensively and detailed elsewhere and she is in agreement.   Procedure Detail:  Patient was brought to the operating room, placed in the supine position on the operating table, and general endotracheal anesthesia induced. She received preoperative IV antibiotics. PAS weren't placed. The abdomen was widely sterilely prepped and draped. Patient timeout was performed and correct procedure verified. Access was attained with a 1-1/2 cm incision at the umbilicus with an open Hassan technique through a mattress suture of 0 Vicryl without difficulty and pneumoperitoneum established. Under direct vision an 11 mm trocar was placed subxiphoid and 25 mm trochars along the right subcostal margin. The patient was status post sleeve gastrectomy and there were moderate adhesions of the falciform ligament to the liver edge and of the right lobe of the liver up the anterior abdominal wall that were carefully taken down with scissors and cautery. The gallbladder was noted to be encased in fibrofatty tissue and this  was carefully taken down off of the fundus the gallbladder which was found to be very thickened and firm with marked chronic and also subacute inflammation. The gallbladder fundus was able to be grasped and elevated and further fibrofatty tissue was stripped down off the neck of the gallbladder working toward the porta hepatis. There were some filmy adhesions of the duodenum up to the infundibulum of the gallbladder that were taken down with careful sharp dissection. As I worked distally the tissue planes were essentially fused as I looked down toward the porta hepatis and the anatomy somewhat distorted from the previous gastric surgery as well. There appeared to be a single smooth plane anteriorly extending well down toward the porta hepatis but felt this likely extended more medially then was safe. I carefully dissected in the mid gallbladder anteriorly and posteriorly with hook cautery freeing it from the liver and then working again back down distally. I was able to discern a very subtle plane along the anterior peritoneum between the gallbladder and what at this point I suspected was the common bile duct which was quite adherent up to the infundibulum of the gallbladder. With careful blunt and sharp dissection without energy I did develop a plane between what he came apparent was a very shrunken gallbladder with a large stone distally which was adherent over quite a broad area to the common hepatic duct. This plane was further developed and posteriorly I did encounter the cystic artery in close triangle and looking more medially I could see the hepatic artery clearly in the porta hepatis consistent with the structure identified with the common bile duct in the proper position. The dissection was very tedious but continuing with careful slow blunt dissection I was able to free the infundibulum of the gallbladder from the common hepatic duct without injury and at this point was able  to obtain a good critical view  with the cystic artery coursing up onto the gallbladder wall and having dissected the distal gallbladder although it was very firm and shrunken and small. The cystic duct was encircled and it was dissected over about a centimeter and a half and dissected 360 at the gallbladder. At this point the anatomy appeared clear and I clipped the cystic duct of the gallbladder junction and an operative cholangiogram was obtained through the cystic duct. This showed as suspected a short cystic duct with good filling of a normal sized common bile duct and intrahepatic ducts with free flow into the duodenum. The Clance cath was removed and the cystic duct was triply clipped proximally and divided and the cystic artery was doubly clipped proximally clipped distally and divided. The gallbladder was then dissected free from the liver. The posterior plane was very chronically inflamed possibly consistent with previous rupture and healing abscess. The gallbladder was eventually detached from the liver bed and placed in an Endo Catch bag and brought out through the umbilical incision. There had been some spillage of small stones which were completely suctioned and irrigated with a liter of saline until clear. Complete hemostasis was assured in the gallbladder bed. I left a 6 Blake drain through a lateral port site in the subhepatic space. There was no evidence of bowel injury or bleeding or other issues seen. All CO2 was evacuated and trochars removed and a mattress suture secured at the umbilical incision. Skin incisions were closed with septic Monocryl and Dermabond. Sponge needle and instrument counts were correct.    Findings: Severe chronic and subacute cholecystitis and cholelithiasis  Estimated Blood Loss:  less than 50 mL         Drains: 19 round Blake drain in subhepatic space  Blood Given: none          Specimens: Gallbladder and contents        Complications:  * No complications entered in OR log *          Disposition: PACU - hemodynamically stable.         Condition: stable

## 2015-08-03 ENCOUNTER — Encounter (HOSPITAL_BASED_OUTPATIENT_CLINIC_OR_DEPARTMENT_OTHER): Payer: Self-pay | Admitting: General Surgery

## 2015-08-03 MED ORDER — HYDROCODONE-ACETAMINOPHEN 5-325 MG PO TABS: 1.0000 | ORAL_TABLET | ORAL | Status: DC | PRN

## 2015-08-03 NOTE — Discharge Instructions (Signed)
[facility] ______[facility] °LAPAROSCOPIC SURGERY: POST OP INSTRUCTIONS °Always review your discharge instruction sheet given to you by the facility where your surgery was performed. °IF YOU HAVE DISABILITY OR FAMILY LEAVE FORMS, YOU MUST BRING THEM TO THE OFFICE FOR PROCESSING.   °DO NOT GIVE THEM TO YOUR DOCTOR. ° °1. A prescription for pain medication may be given to you upon discharge.  Take your pain medication as prescribed, if needed.  If narcotic pain medicine is not needed, then you may take acetaminophen (Tylenol) or ibuprofen (Advil) as needed. °2. Take your usually prescribed medications unless otherwise directed. °3. If you need a refill on your pain medication, please contact your pharmacy.  They will contact our office to request authorization. Prescriptions will not be filled after 5pm or on week-ends. °4. You should follow a light diet the first few days after arrival home, such as soup and crackers, etc.  Be sure to include lots of fluids daily. °5. Most patients will experience some swelling and bruising in the area of the incisions.  Ice packs will help.  Swelling and bruising can take several days to resolve.  °6. It is common to experience some constipation if taking pain medication after surgery.  Increasing fluid intake and taking a stool softener (such as Colace) will usually help or prevent this problem from occurring.  A mild laxative (Milk of Magnesia or Miralax) should be taken according to package instructions if there are no bowel movements after 48 hours. °7. Unless discharge instructions indicate otherwise, you may remove your bandages 24-48 hours after surgery, and you may shower at that time.  You may have steri-strips (small skin tapes) in place directly over the incision.  These strips should be left on the skin for 7-10 days.  If your surgeon used skin glue on the incision, you may shower in 24 hours.  The glue will flake off over the next 2-3 weeks.  Any sutures or  staples will be removed at the office during your follow-up visit. °8. ACTIVITIES:  You may resume regular (light) daily activities beginning the next day--such as daily self-care, walking, climbing stairs--gradually increasing activities as tolerated.  You may have sexual intercourse when it is comfortable.  Refrain from any heavy lifting or straining until approved by your doctor. °a. You may drive when you are no longer taking prescription pain medication, you can comfortably wear a seatbelt, and you can safely maneuver your car and apply brakes. °b. RETURN TO WORK:  __________________________________________________________ °9. You should see your doctor in the office for a follow-up appointment approximately 2-3 weeks after your surgery.  Make sure that you call for this appointment within a day or two after you arrive home to insure a convenient appointment time. °10. OTHER INSTRUCTIONS: __________________________________________________________________________________________________________________________ __________________________________________________________________________________________________________________________ °WHEN TO CALL YOUR DOCTOR: °1. Fever over 101.0 °2. Inability to urinate °3. Continued bleeding from incision. °4. Increased pain, redness, or drainage from the incision. °5. Increasing abdominal pain ° °The clinic staff is available to answer your questions during regular business hours.  Please don’t hesitate to call and ask to speak to one of the nurses for clinical concerns.  If you have a medical emergency, go to the nearest emergency room or call 911.  A surgeon from [facility] is always on call at the hospital. °[address], [address], [place], [place]  [address] ? [address] [address], [place], [place]   [address] °[phone] ? [phone] ? FAX [phone] °Web site:   [email or web address] °

## 2015-08-03 NOTE — Discharge Summary (Signed)
   Patient ID: Robin Cox [id number] 45 y.o. 11-06-1970  08/02/2015  Discharge date and time: 08/03/2015   Admitting Physician: Excell Seltzer T  Discharge Physician: Excell Seltzer T  Admission Diagnoses: Cholelithiasis  Discharge Diagnoses: Cholelithiasis and severe cholecystitis  Operations: Procedure(s): LAPAROSCOPIC CHOLECYSTECTOMY WITH INTRAOPERATIVE CHOLANGIOGRAM  Admission Condition: fair  Discharged Condition: good  Indication for Admission: Patient is a 45 year old female with weight loss secondary to sleeve gastrectomy who presents with repeated episodes of severe epigastric and right upper quadrant pain over several weeks. Gallbladder ultrasound shows multiple gallstones. After preoperative discussion regarding indications and risks detailed elsewhere we plan to proceed with laparoscopic cholecystectomy and she requires overnight observation due to history of sleep apnea  Hospital Course: Patient underwent laparoscopic cholecystectomy with findings of severe chronic and subacute cholecystitis. The procedure was difficult but went smoothly. A drain was left in place. The following morning she has mild pain. No nausea. Ambulatory. Abdomen is soft and nontender.  JP drainage is serosanguineous and she is felt ready for discharge.   Disposition: Home  Patient Instructions:    Medication List    TAKE these medications        B-12 (METHYLCOBALAMIN) SL  Place under the tongue.     Biotin 2.5 MG Caps  Take by mouth.     CALCIUM + D PO  Take by mouth.     cetirizine 5 MG tablet  Commonly known as:  ZYRTEC  Take 5 mg by mouth daily.     HYDROcodone-acetaminophen 5-325 MG tablet  Commonly known as:  NORCO/VICODIN  Take 1-2 tablets by mouth every 4 (four) hours as needed.     INTEGRA 62.5-62.5-40-3 MG Caps  Take 1 capsule by mouth daily. Take one capsule daily.  Generic OK     methocarbamol 500 MG tablet  Commonly known as:  ROBAXIN  Take 1 tablet  (500 mg total) by mouth 2 (two) times daily.     MULTIPLE VITAMIN PO  Take by mouth.     omeprazole 20 MG capsule  Commonly known as:  PRILOSEC  Take 20 mg by mouth daily.     polyethylene glycol packet  Commonly known as:  MIRALAX / GLYCOLAX  Take 17 g by mouth daily as needed (for constipation).        Activity: activity as tolerated Diet: regular diet Wound Care: none needed  Follow-up:  With Dr. Excell Seltzer in 1 week.  Signed: Edward Jolly MD, FACS  08/03/2015, 8:23 AM

## 2015-09-08 ENCOUNTER — Ambulatory Visit (INDEPENDENT_AMBULATORY_CARE_PROVIDER_SITE_OTHER): Payer: 59 | Admitting: Family Medicine

## 2015-09-08 VITALS — BP 114/70 | HR 85 | Temp 98.0°F | Ht 61.4 in | Wt 196.0 lb

## 2015-09-08 DIAGNOSIS — R05 Cough: Secondary | ICD-10-CM

## 2015-09-08 DIAGNOSIS — J302 Other seasonal allergic rhinitis: Secondary | ICD-10-CM

## 2015-09-08 DIAGNOSIS — J069 Acute upper respiratory infection, unspecified: Secondary | ICD-10-CM

## 2015-09-08 DIAGNOSIS — R059 Cough, unspecified: Secondary | ICD-10-CM

## 2015-09-08 MED ORDER — HYDROCODONE-HOMATROPINE 5-1.5 MG/5ML PO SYRP: 5.0000 mL | ORAL_SOLUTION | Freq: Every evening | ORAL | Status: DC | PRN

## 2015-09-08 MED ORDER — FLUTICASONE PROPIONATE 50 MCG/ACT NA SUSP: 2.0000 | Freq: Every day | NASAL | Status: DC

## 2015-09-08 MED ORDER — SULFAMETHOXAZOLE-TRIMETHOPRIM 800-160 MG PO TABS: 1.0000 | ORAL_TABLET | Freq: Two times a day (BID) | ORAL | Status: DC

## 2015-09-08 NOTE — Progress Notes (Signed)
Subjective:    Patient ID: Robin Cox, female    DOB: 1971-02-22, 45 y.o.   MRN: [id number]  HPI This is a pleasant 45 yo female who presents today with 7 days of cough, nasal drainage, sore throat, ear pain, headache, nasal congestion. Thinks she had a fever 4 days ago. Has taken an entire bottle of tylenol cold and flu over the course of the week and has restarted her claritin. She feels better today.   She frequently has spring/fall allergies. Had been taking Claritin year round until this year. 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 No history of asthma, no smoking history, no sick contacts.   Had gallbladder removed last month. Has done well following surgery.   Past Medical History  Diagnosis Date  . Allergy   . Morbid obesity ([place])   . Headache(784.0)     h/a's prior to starting b/p medication - no problems since  . Anemia     pt on iron pills  . Hypertension     no meds since wt loss  . Sleep apnea     no CPAP since wt loss  . GERD (gastroesophageal reflux disease)    Past Surgical History  Procedure Laterality Date  . Dilation and curettage of uterus  1990  . Laparoscopic gastric sleeve resection N/A 12/30/2012    Procedure: LAPAROSCOPIC GASTRIC SLEEVE RESECTION;  Surgeon: Edward Jolly, MD;  Location: [facility];  Service: General;  Laterality: N/A;  Laparoscopic Sleeve Gastrectomy   . Esophagogastroduodenoscopy N/A 12/30/2012    Procedure: ESOPHAGOGASTRODUODENOSCOPY (EGD);  Surgeon: Edward Jolly, MD;  Location: [facility];  Service: General;  Laterality: N/A;  . Cholecystectomy N/A 08/02/2015    Procedure: LAPAROSCOPIC CHOLECYSTECTOMY WITH INTRAOPERATIVE CHOLANGIOGRAM;  Surgeon: Excell Seltzer, MD;  Location: [facility];  Service: General;  Laterality: N/A;   Family History  Problem Relation Age of Onset  . Hypertension Brother   . Allergies Brother   . Pancreatic cancer Maternal Grandmother     pancreatic  . Allergies Sister   .  Hypertension Sister   . Breast cancer Maternal Aunt   . Allergies Sister   . Stroke Brother    Social History  Substance Use Topics  . Smoking status: Never Smoker   . Smokeless tobacco: Never Used  . Alcohol Use: Yes     Comment: Rare     Review of Systems  Constitutional: Positive for fever (subjective 3 days ago), chills and fatigue.  HENT: Positive for congestion, postnasal drip, rhinorrhea and sore throat. Negative for sinus pressure.   Respiratory: Positive for wheezing (some yesterday). Negative for shortness of breath. Cough: thick, greenish and clear.   Allergic/Immunologic: Positive for environmental allergies.  Neurological: Positive for headaches (mild, intermittent).       Objective:   Physical Exam  Constitutional: She is oriented to person, place, and time. She appears well-developed and well-nourished. No distress.  HENT:  Head: Normocephalic and atraumatic.  Right Ear: Tympanic membrane, external ear and ear canal normal.  Left Ear: Tympanic membrane, external ear and ear canal normal.  Nose: Mucosal edema and rhinorrhea present. Right sinus exhibits no maxillary sinus tenderness and no frontal sinus tenderness. Left sinus exhibits no maxillary sinus tenderness and no frontal sinus tenderness.  Mouth/Throat: Uvula is midline. No oropharyngeal exudate or posterior oropharyngeal erythema.  +1 tonsils, large amount post nasal drainage  Cardiovascular: Normal rate, regular rhythm and normal heart sounds.   Pulmonary/Chest: Effort normal and breath sounds  normal.  Neurological: She is alert and oriented to person, place, and time.  Skin: Skin is warm and dry. She is not diaphoretic.  Psychiatric: She has a normal mood and affect. Her behavior is normal. Judgment and thought content normal.  Vitals reviewed.     BP 114/70 mmHg  Pulse 85  Temp(Src) 98 F (36.7 C) (Oral)  Ht 5' 1.4" (1.56 m)  Wt 196 lb (88.905 kg)  BMI 36.53 kg/m2  SpO2 99%  LMP 09/01/2015  (Exact Date) Wt Readings from Last 3 Encounters:  09/08/15 196 lb (88.905 kg)  08/02/15 198 lb (89.812 kg)  07/25/15 198 lb (89.812 kg)       Assessment & Plan:  1. Cough - HYDROcodone-homatropine (HYCODAN) 5-1.5 MG/5ML syrup; Take 5 mLs by mouth at bedtime as needed for cough.  Dispense: 70 mL; Refill: 0  2. Other seasonal allergic rhinitis - fluticasone (FLONASE) 50 MCG/ACT nasal spray; Place 2 sprays into both nostrils daily.  Dispense: 16 g; Refill: 6 - continue otc Claritin daily  3. Acute upper respiratory infection - suspect allergies/virus and gave written and verbal instructions for symptomatic treatment: Please get a thermometer to monitor your temperature when you are sick. For muscle aches, headache, sore throat you can take over the counter acetaminophen or ibuprofen as directed on the package For nasal congestion you can use Afrin nasal spray twice a day for up to 3 days, and /or sudafed, and/or saline nasal spray For cough you can use Delsym cough syrup Please come back to see [place] or go to the emergency department if you are not better in 5 to 7 days or if you develop fever over 101 for more than 48 hours or if you develop wheezing or shortness of breath.  - Provided wait and see antibiotic prescription if she is not feeling better in 24 hours- sulfamethoxazole-trimethoprim (BACTRIM DS,SEPTRA DS) 800-160 MG tablet; Take 1 tablet by mouth 2 (two) times daily.  Dispense: 10 tablet; Refill: 0   Clarene Reamer, FNP-BC  [facility]  09/08/2015 10:00 AM

## 2015-09-08 NOTE — Patient Instructions (Addendum)
Please get a thermometer to monitor your temperature when you are sick. For muscle aches, headache, sore throat you can take over the counter acetaminophen or ibuprofen as directed on the package For nasal congestion you can use Afrin nasal spray twice a day for up to 3 days, and /or sudafed, and/or saline nasal spray For cough you can use Delsym cough syrup Please come back to see [place] or go to the emergency department if you are not better in 5 to 7 days or if you develop fever over 101 for more than 48 hours or if you develop wheezing or shortness of breath.   Start antibiotic if you are not better in 24 hours  IF you received an x-ray today, you will receive an invoice from [facility]. Please contact [facility] at [phone] with questions or concerns regarding your invoice.   IF you received labwork today, you will receive an invoice from [employer]. Please contact [facility] at [phone] with questions or concerns regarding your invoice.   Our billing staff will not be able to assist you with questions regarding bills from these companies.  You will be contacted with the lab results as soon as they are available. The fastest way to get your results is to activate your My Chart account. Instructions are located on the last page of this paperwork. If you have not heard from [place] regarding the results in 2 weeks, please contact this office.

## 2016-10-31 DIAGNOSIS — J020 Streptococcal pharyngitis: Secondary | ICD-10-CM | POA: Diagnosis not present

## 2017-01-02 DIAGNOSIS — Z01419 Encounter for gynecological examination (general) (routine) without abnormal findings: Secondary | ICD-10-CM | POA: Diagnosis not present

## 2017-05-12 ENCOUNTER — Encounter (HOSPITAL_COMMUNITY): Payer: Self-pay | Admitting: *Deleted

## 2017-05-12 ENCOUNTER — Other Ambulatory Visit: Payer: Self-pay

## 2017-05-12 ENCOUNTER — Ambulatory Visit (HOSPITAL_COMMUNITY)
Admission: EM | Admit: 2017-05-12 | Discharge: 2017-05-12 | Disposition: A | Discharge status: Home / Self Care | Payer: 59 | Attending: Internal Medicine | Admitting: Internal Medicine

## 2017-05-12 DIAGNOSIS — J00 Acute nasopharyngitis [common cold]: Secondary | ICD-10-CM | POA: Diagnosis not present

## 2017-05-12 NOTE — Discharge Instructions (Signed)
Push fluids to ensure adequate hydration and keep secretions thin.  Tylenol and/or ibuprofen as needed for pain or fevers.  Continue with throat lozenges or over the counter treatments as needed for symptoms.  If symptoms worsen or do not improve in the next week to return to be seen or to follow up with PCP.

## 2017-05-12 NOTE — ED Triage Notes (Signed)
Reports having congestion, scratchy throat & ears, HA x 3 days.  No having facial pressure.  Unsure if fevers.

## 2017-05-12 NOTE — ED Provider Notes (Signed)
[place]    CSN: [id number] Arrival date & time: 05/12/17  1300     History   Chief Complaint Chief Complaint  Patient presents with  . Nasal Congestion  . Headache    HPI Robin Cox is a 46 y.o. female.   Robin Cox presents with complaints of scratchy throat, headache, body aches and nasal drainage which started 11/13. She feels she is improving. Mild cough with drainage. Denies gi/gu complaints. No known fevers. Has had ill coworkers. Took alkaselzer cold medication yesterday, did seem to help. Without skin rash. Does not take any regular medications.   ROS per HPI.       Past Medical History:  Diagnosis Date  . Allergy   . Anemia    pt on iron pills  . GERD (gastroesophageal reflux disease)   . Headache(784.0)    h/a's prior to starting b/p medication - no problems since  . Hypertension    no meds since wt loss  . Morbid obesity ([place])   . Sleep apnea    no CPAP since wt loss    Patient Active Problem List   Diagnosis Date Noted  . Cholelithiasis and cholecystitis without obstruction 08/02/2015  . Obesity 08/03/2013  . Dehydration 02/22/2013  . Hypokalemia 02/22/2013  . Abdominal pain, left upper quadrant 02/22/2013  . Nausea alone 02/09/2013  . Anemia 12/08/2012  . Preop cardiovascular exam 10/27/2012  . Dyspnea 09/23/2012  . Cellulitis of leg, left 09/23/2012  . Arrhythmia, sinus node 06/16/2012  . OSA (obstructive sleep apnea) 06/16/2012    Past Surgical History:  Procedure Laterality Date  . CHOLECYSTECTOMY N/A 08/02/2015   Procedure: LAPAROSCOPIC CHOLECYSTECTOMY WITH INTRAOPERATIVE CHOLANGIOGRAM;  Surgeon: Excell Seltzer, MD;  Location: [facility];  Service: General;  Laterality: N/A;  . [place]  . ESOPHAGOGASTRODUODENOSCOPY N/A 12/30/2012   Procedure: ESOPHAGOGASTRODUODENOSCOPY (EGD);  Surgeon: Edward Jolly, MD;  Location[facility];  Service: General;  Laterality: N/A;  .  LAPAROSCOPIC GASTRIC SLEEVE RESECTION N/A 12/30/2012   Procedure: LAPAROSCOPIC GASTRIC SLEEVE RESECTION;  Surgeon: Edward Jolly, MD;  Location[facility];  Service: General;  Laterality: N/A;  Laparoscopic Sleeve Gastrectomy     OB History    Gravida Para Term Preterm AB Living   1         0   SAB TAB Ectopic Multiple Live Births                   Home Medications    Prior to Admission medications   Medication Sig Start Date End Date Taking? Authorizing Provider  Biotin 2.5 MG CAPS Take by mouth.    [provider]  Calcium Carbonate-Vitamin D (CALCIUM + D PO) Take by mouth.    [provider]  Fe Fum-FePoly-Vit C-Vit B3 (INTEGRA) 62.5-62.5-40-3 MG CAPS Take 1 capsule by mouth daily. Take one capsule daily.  Generic OK 08/03/13   Schoenhoff, Altamese Cabal, MD  fluticasone (FLONASE) 50 MCG/ACT nasal spray Place 2 sprays into both nostrils daily. 09/08/15   Elby Beck, FNP  HYDROcodone-homatropine (HYCODAN) 5-1.5 MG/5ML syrup Take 5 mLs by mouth at bedtime as needed for cough. 09/08/15   Elby Beck, FNP  loratadine (CLARITIN) 10 MG tablet Take 10 mg by mouth daily.    [provider]  MULTIPLE VITAMIN PO Take by mouth.    [provider]  sulfamethoxazole-trimethoprim (BACTRIM DS,SEPTRA DS) 800-160 MG tablet Take 1 tablet by mouth 2 (  two) times daily. 09/08/15   Elby Beck, FNP    Family History Family History  Problem Relation Age of Onset  . Hypertension Brother   . Allergies Brother   . Pancreatic cancer Maternal Grandmother        pancreatic  . Allergies Sister   . Hypertension Sister   . Breast cancer Maternal Aunt   . Allergies Sister   . Stroke Brother     Social History Social History   Tobacco Use  . Smoking status: Never Smoker  . Smokeless tobacco: Never Used  Substance Use Topics  . Alcohol use: Yes    Comment: Rare  . Drug use: No     Allergies   Augmentin [amoxicillin-pot clavulanate] and  Azithromycin   Review of Systems Review of Systems   Physical Exam Triage Vital Signs ED Triage Vitals  Enc Vitals Group     BP 05/12/17 1329 122/73     Pulse Rate 05/12/17 1329 62     Resp 05/12/17 1329 18     Temp 05/12/17 1329 98.2 F (36.8 C)     Temp Source 05/12/17 1329 Oral     SpO2 05/12/17 1329 100 %     Weight --      Height --      Head Circumference --      Peak Flow --      Pain Score 05/12/17 1330 3     Pain Loc --      Pain Edu? --      Excl. in [place]? --    No data found.  Updated Vital Signs BP 122/73   Pulse 62   Temp 98.2 F (36.8 C) (Oral)   Resp 18   LMP 04/16/2017 (Approximate)   SpO2 100%   Visual Acuity Right Eye Distance:   Left Eye Distance:   Bilateral Distance:    Right Eye Near:   Left Eye Near:    Bilateral Near:     Physical Exam  Constitutional: She is oriented to person, place, and time. She appears well-developed and well-nourished. No distress.  HENT:  Head: Normocephalic and atraumatic.  Right Ear: Tympanic membrane, external ear and ear canal normal.  Left Ear: Tympanic membrane, external ear and ear canal normal.  Nose: Nose normal.  Mouth/Throat: Uvula is midline, oropharynx is clear and moist and mucous membranes are normal. No tonsillar exudate.  Eyes: Conjunctivae and EOM are normal. Pupils are equal, round, and reactive to light.  Cardiovascular: Normal rate, regular rhythm and normal heart sounds.  Pulmonary/Chest: Effort normal and breath sounds normal.  Neurological: She is alert and oriented to person, place, and time.  Skin: Skin is warm and dry.     UC Treatments / Results  Labs (all labs ordered are listed, but only abnormal results are displayed) Labs Reviewed - No data to display  EKG  EKG Interpretation None       Radiology No results found.  Procedures Procedures (including critical care time)  Medications Ordered in UC Medications - No data to display   Initial Impression /  Assessment and Plan / UC Course  I have reviewed the triage vital signs and the nursing notes.  Pertinent labs & imaging results that were available during my care of the patient were reviewed by me and considered in my medical decision making (see chart for details).     Benign physical exam. Continue with supportive cares. Tylenol and/or ibuprofen as needed for pain or fevers. Push  fluids to ensure adequate hydration and keep secretions thin.  If symptoms worsen or do not improve in the next week to return to be seen or to follow up with PCP.  Patient verbalized understanding and agreeable to plan.    Final Clinical Impressions(s) / UC Diagnoses   Final diagnoses:  Acute nasopharyngitis    ED Discharge Orders    None       Controlled Substance Prescriptions [place] Controlled Substance Registry consulted? Not Applicable   Zigmund Gottron, NP 05/12/17 [phone]

## 2017-08-09 ENCOUNTER — Encounter (HOSPITAL_COMMUNITY): Payer: Self-pay

## 2017-11-15 DIAGNOSIS — R5383 Other fatigue: Secondary | ICD-10-CM | POA: Diagnosis not present

## 2017-11-15 DIAGNOSIS — Z0000 Encounter for general adult medical examination without abnormal findings: Secondary | ICD-10-CM | POA: Diagnosis not present

## 2017-11-29 ENCOUNTER — Other Ambulatory Visit: Payer: Self-pay | Admitting: Obstetrics and Gynecology

## 2017-12-04 ENCOUNTER — Ambulatory Visit (HOSPITAL_COMMUNITY)
Admission: RE | Admit: 2017-12-04 | Discharge: 2017-12-04 | Disposition: A | Discharge status: Home / Self Care | Payer: 59 | Source: Ambulatory Visit | Attending: Obstetrics and Gynecology | Admitting: Obstetrics and Gynecology

## 2017-12-04 DIAGNOSIS — D509 Iron deficiency anemia, unspecified: Secondary | ICD-10-CM | POA: Insufficient documentation

## 2017-12-04 MED ORDER — SODIUM CHLORIDE 0.9 % IV SOLN
750.0000 mg | Freq: Once | INTRAVENOUS | Status: AC
  Administered 2017-12-04: 750 mg via INTRAVENOUS
  Filled 2017-12-04: qty 15

## 2017-12-04 MED ORDER — SODIUM CHLORIDE 0.9 % IV SOLN
INTRAVENOUS | Status: DC
  Administered 2017-12-04: 11:00:00 via INTRAVENOUS

## 2017-12-04 NOTE — Progress Notes (Signed)
PATIENT CARE CENTER NOTE  Diagnosis: Iron Deficiency Anemia    Provider: Dr. Garwin Brothers    Procedure: IV injectafer   Note: Patient received infusion of Injectafer. Patient tolerated infusion well with no adverse reaction. Monitored patient for 30 minutes after infusion and vital signs taken. Discharge instructions given to patient. Verbal order received from Dr. Garwin Brothers RN Joaquin Courts) for patient to receive second dose of 750 mg Injectafer next week on 12/11/17. Patient alert, oriented and ambulatory at discharge.

## 2017-12-04 NOTE — Discharge Instructions (Signed)
Ferric carboxymaltose injection What is this medicine? FERRIC CARBOXYMALTOSE (ferr-ik car-box-ee-mol-toes) is an iron complex. Iron is used to make healthy red blood cells, which carry oxygen and nutrients throughout the body. This medicine is used to treat anemia in people with chronic kidney disease or people who cannot take iron by mouth. This medicine may be used for other purposes; ask your health care provider or pharmacist if you have questions. COMMON BRAND NAME(S): Injectafer What should I tell my health care provider before I take this medicine? They need to know if you have any of these conditions: -anemia not caused by low iron levels -high levels of iron in the blood -liver disease -an unusual or allergic reaction to iron, other medicines, foods, dyes, or preservatives -pregnant or trying to get pregnant -breast-feeding How should I use this medicine? This medicine is for infusion into a vein. It is given by a health care professional in a hospital or clinic setting. Talk to your pediatrician regarding the use of this medicine in children. Special care may be needed. Overdosage: If you think you have taken too much of this medicine contact a poison control center or emergency room at once. NOTE: This medicine is only for you. Do not share this medicine with others. What if I miss a dose? It is important not to miss your dose. Call your doctor or health care professional if you are unable to keep an appointment. What may interact with this medicine? Do not take this medicine with any of the following medications: -deferoxamine -dimercaprol -other iron products This medicine may also interact with the following medications: -chloramphenicol -deferasirox This list may not describe all possible interactions. Give your health care provider a list of all the medicines, herbs, non-prescription drugs, or dietary supplements you use. Also tell them if you smoke, drink alcohol, or use  illegal drugs. Some items may interact with your medicine. What should I watch for while using this medicine? Visit your doctor or health care professional regularly. Tell your doctor if your symptoms do not start to get better or if they get worse. You may need blood work done while you are taking this medicine. You may need to follow a special diet. Talk to your doctor. Foods that contain iron include: whole grains/cereals, dried fruits, beans, or peas, leafy green vegetables, and organ meats (liver, kidney). What side effects may I notice from receiving this medicine? Side effects that you should report to your doctor or health care professional as soon as possible: -allergic reactions like skin rash, itching or hives, swelling of the face, lips, or tongue -breathing problems -changes in blood pressure -feeling faint or lightheaded, falls -flushing, sweating, or hot feelings Side effects that usually do not require medical attention (report to your doctor or health care professional if they continue or are bothersome): -changes in taste -constipation -dizziness -headache -nausea -pain, redness, or irritation at site where injected -vomiting This list may not describe all possible side effects. Call your doctor for medical advice about side effects. You may report side effects to FDA at 1-800-FDA-1088. Where should I keep my medicine? This drug is given in a hospital or clinic and will not be stored at home. NOTE: This sheet is a summary. It may not cover all possible information. If you have questions about this medicine, talk to your doctor, pharmacist, or health care provider.  2018 Elsevier/Gold Standard (2015-06-16 11:20:47)  

## 2017-12-11 ENCOUNTER — Ambulatory Visit (HOSPITAL_COMMUNITY)
Admission: RE | Admit: 2017-12-11 | Discharge: 2017-12-11 | Disposition: A | Discharge status: Home / Self Care | Payer: 59 | Source: Ambulatory Visit | Attending: Obstetrics and Gynecology | Admitting: Obstetrics and Gynecology

## 2017-12-11 DIAGNOSIS — D509 Iron deficiency anemia, unspecified: Secondary | ICD-10-CM | POA: Diagnosis not present

## 2017-12-11 MED ORDER — SODIUM CHLORIDE 0.9 % IV SOLN
Freq: Once | INTRAVENOUS | Status: AC
  Administered 2017-12-11: 11:00:00 via INTRAVENOUS

## 2017-12-11 MED ORDER — SODIUM CHLORIDE 0.9 % IV SOLN
750.0000 mg | Freq: Once | INTRAVENOUS | Status: AC
  Administered 2017-12-11: 750 mg via INTRAVENOUS
  Filled 2017-12-11: qty 15

## 2017-12-11 NOTE — Progress Notes (Signed)
Patient received Injectifar via PIV. Observed for 30 minutes post infusion. Tolerated well, vitals stable, IV taken out, discharge instructions given, verbalized understanding. Patient alert, oriented and ambulatory at the time of discharge.

## 2017-12-11 NOTE — Discharge Instructions (Signed)
Ferric carboxymaltose injection What is this medicine? FERRIC CARBOXYMALTOSE (ferr-ik car-box-ee-mol-toes) is an iron complex. Iron is used to make healthy red blood cells, which carry oxygen and nutrients throughout the body. This medicine is used to treat anemia in people with chronic kidney disease or people who cannot take iron by mouth. This medicine may be used for other purposes; ask your health care provider or pharmacist if you have questions. COMMON BRAND NAME(S): Injectafer What should I tell my health care provider before I take this medicine? They need to know if you have any of these conditions: -anemia not caused by low iron levels -high levels of iron in the blood -liver disease -an unusual or allergic reaction to iron, other medicines, foods, dyes, or preservatives -pregnant or trying to get pregnant -breast-feeding How should I use this medicine? This medicine is for infusion into a vein. It is given by a health care professional in a hospital or clinic setting. Talk to your pediatrician regarding the use of this medicine in children. Special care may be needed. Overdosage: If you think you have taken too much of this medicine contact a poison control center or emergency room at once. NOTE: This medicine is only for you. Do not share this medicine with others. What if I miss a dose? It is important not to miss your dose. Call your doctor or health care professional if you are unable to keep an appointment. What may interact with this medicine? Do not take this medicine with any of the following medications: -deferoxamine -dimercaprol -other iron products This medicine may also interact with the following medications: -chloramphenicol -deferasirox This list may not describe all possible interactions. Give your health care provider a list of all the medicines, herbs, non-prescription drugs, or dietary supplements you use. Also tell them if you smoke, drink alcohol, or use  illegal drugs. Some items may interact with your medicine. What should I watch for while using this medicine? Visit your doctor or health care professional regularly. Tell your doctor if your symptoms do not start to get better or if they get worse. You may need blood work done while you are taking this medicine. You may need to follow a special diet. Talk to your doctor. Foods that contain iron include: whole grains/cereals, dried fruits, beans, or peas, leafy green vegetables, and organ meats (liver, kidney). What side effects may I notice from receiving this medicine? Side effects that you should report to your doctor or health care professional as soon as possible: -allergic reactions like skin rash, itching or hives, swelling of the face, lips, or tongue -breathing problems -changes in blood pressure -feeling faint or lightheaded, falls -flushing, sweating, or hot feelings Side effects that usually do not require medical attention (report to your doctor or health care professional if they continue or are bothersome): -changes in taste -constipation -dizziness -headache -nausea -pain, redness, or irritation at site where injected -vomiting This list may not describe all possible side effects. Call your doctor for medical advice about side effects. You may report side effects to FDA at 1-800-FDA-1088. Where should I keep my medicine? This drug is given in a hospital or clinic and will not be stored at home. NOTE: This sheet is a summary. It may not cover all possible information. If you have questions about this medicine, talk to your doctor, pharmacist, or health care provider.  2018 Elsevier/Gold Standard (2015-06-16 11:20:47)  

## 2017-12-24 DIAGNOSIS — D251 Intramural leiomyoma of uterus: Secondary | ICD-10-CM | POA: Diagnosis not present

## 2017-12-24 DIAGNOSIS — N938 Other specified abnormal uterine and vaginal bleeding: Secondary | ICD-10-CM | POA: Diagnosis not present

## 2017-12-24 DIAGNOSIS — D649 Anemia, unspecified: Secondary | ICD-10-CM | POA: Diagnosis not present

## 2018-01-10 DIAGNOSIS — Z01419 Encounter for gynecological examination (general) (routine) without abnormal findings: Secondary | ICD-10-CM | POA: Diagnosis not present

## 2018-01-10 DIAGNOSIS — Z0000 Encounter for general adult medical examination without abnormal findings: Secondary | ICD-10-CM | POA: Diagnosis not present

## 2018-01-10 DIAGNOSIS — N938 Other specified abnormal uterine and vaginal bleeding: Secondary | ICD-10-CM | POA: Diagnosis not present

## 2018-01-10 DIAGNOSIS — Z13220 Encounter for screening for lipoid disorders: Secondary | ICD-10-CM | POA: Diagnosis not present

## 2018-01-10 DIAGNOSIS — Z6834 Body mass index (BMI) 34.0-34.9, adult: Secondary | ICD-10-CM | POA: Diagnosis not present

## 2018-01-10 DIAGNOSIS — Z1231 Encounter for screening mammogram for malignant neoplasm of breast: Secondary | ICD-10-CM | POA: Diagnosis not present

## 2018-02-19 ENCOUNTER — Encounter: Payer: Self-pay | Admitting: Family Medicine

## 2018-02-19 ENCOUNTER — Ambulatory Visit (INDEPENDENT_AMBULATORY_CARE_PROVIDER_SITE_OTHER): Payer: 59 | Admitting: Family Medicine

## 2018-02-19 ENCOUNTER — Other Ambulatory Visit: Payer: Self-pay

## 2018-02-19 VITALS — BP 114/75 | HR 61 | Temp 98.7°F | Resp 20 | Ht 62.01 in | Wt 190.0 lb

## 2018-02-19 DIAGNOSIS — F410 Panic disorder [episodic paroxysmal anxiety] without agoraphobia: Secondary | ICD-10-CM | POA: Diagnosis not present

## 2018-02-19 DIAGNOSIS — F411 Generalized anxiety disorder: Secondary | ICD-10-CM

## 2018-02-19 MED ORDER — SERTRALINE HCL 50 MG PO TABS: 50.0000 mg | ORAL_TABLET | Freq: Every day | ORAL | 3 refills | Status: DC

## 2018-02-19 MED ORDER — CLONAZEPAM 1 MG PO TABS: 1.0000 mg | ORAL_TABLET | Freq: Two times a day (BID) | ORAL | 1 refills | Status: DC | PRN

## 2018-02-19 NOTE — Progress Notes (Signed)
Patient ID: Robin Cox, female    DOB: 10/21/1970  Age: 47 y.o. MRN: [id number]  Chief Complaint  Patient presents with  . Anxiety    screening done  . Depression    screening done    Subjective:   47 year old lady who is here with anxiety issues.  She works as a Animal nutritionist at [employer].  She has worked for 2 years and done fine.  She is now developed a panic sensation when she attempts to go to work.  She has started out to work and just not been able to go because she gets so panicky, feeling her heart racing and feeling extreme anxiety.  With this she has anticipation anxiety that does not allow her to sleep before going to work.  The rest of her life seem to be going well.  She is single and lives alone.  She gets along well with other people.  She is involved spiritually.  She is not taking other medications.  She does not smoke, rarely drinks, drinks a lot of iced tea.  She gets a little bit of exercise.  Current allergies, medications, problem list, past/family and social histories reviewed.  Objective:  BP 114/75   Pulse 61   Temp 98.7 F (37.1 C) (Oral)   Resp 20   Ht 5' 2.01" (1.575 m)   Wt 190 lb (86.2 kg)   LMP 02/14/2018 (Exact Date)   SpO2 100%   BMI 34.74 kg/m   Did not examine her today she is not having physical complaints.  Assessment & Plan:   Assessment: 1. Anxiety state   2. Panic       Plan: See instructions  No orders of the defined types were placed in this encounter.   No orders of the defined types were placed in this encounter.        Patient Instructions   Try to get enough rest.  The antianxiety medication will help you to start sleeping a little better.  Get regular daily exercise, taking a walk.  See the counselor as planned  Begin sertraline 50 mg 1 daily, usually best taken in the morning.  This takes 2 or 3 weeks getting it into your system before you will notice the change.  For  short-term relief take clonazepam 0.5 mg 1 twice daily in the morning and in the evening.  Will help you to sleep some.  This should not be taken long-term because it does have habit-forming potential.  Return in 1 month for a follow-up visit.  Schedule yourself with 1 of the other providers who is taking appointments.  If abruptly worse at any time go to the emergency room if necessary.    If you have lab work done today you will be contacted with your lab results within the next 2 weeks.  If you have not heard from [place] then please contact us. The fastest way to get your results is to register for My Chart.   IF you received an x-ray today, you will receive an invoice from [facility]. Please contact [facility] at [phone] with questions or concerns regarding your invoice.   IF you received labwork today, you will receive an invoice from [place]. Please contact [facility] at [phone] with questions or concerns regarding your invoice.   Our billing staff will not be able to assist you with questions regarding bills from these companies.  You will be contacted with the lab results as soon  as they are available. The fastest way to get your results is to activate your My Chart account. Instructions are located on the last page of this paperwork. If you have not heard from [place] regarding the results in 2 weeks, please contact this office.        Return in about 4 weeks (around 03/19/2018), or establish a regular provider.   Ruben Reason, MD 02/19/2018

## 2018-02-19 NOTE — Patient Instructions (Addendum)
Try to get enough rest.  The antianxiety medication will help you to start sleeping a little better.  Get regular daily exercise, taking a walk.  See the counselor as planned  Begin sertraline 50 mg 1 daily, usually best taken in the morning.  This takes 2 or 3 weeks getting it into your system before you will notice the change.  For short-term relief take clonazepam 0.5 mg 1 twice daily in the morning and in the evening.  Will help you to sleep some.  This should not be taken long-term because it does have habit-forming potential.  Return in 1 month for a follow-up visit.  Schedule yourself with 1 of the other providers who is taking appointments.  If abruptly worse at any time go to the emergency room if necessary.    If you have lab work done today you will be contacted with your lab results within the next 2 weeks.  If you have not heard from [place] then please contact us. The fastest way to get your results is to register for My Chart.   IF you received an x-ray today, you will receive an invoice from [facility]. Please contact [facility] at [phone] with questions or concerns regarding your invoice.   IF you received labwork today, you will receive an invoice from [place]. Please contact [facility] at [phone] with questions or concerns regarding your invoice.   Our billing staff will not be able to assist you with questions regarding bills from these companies.  You will be contacted with the lab results as soon as they are available. The fastest way to get your results is to activate your My Chart account. Instructions are located on the last page of this paperwork. If you have not heard from [place] regarding the results in 2 weeks, please contact this office.

## 2018-08-27 ENCOUNTER — Telehealth: Payer: Self-pay | Admitting: Family Medicine

## 2018-08-27 ENCOUNTER — Other Ambulatory Visit: Payer: Self-pay

## 2018-08-27 ENCOUNTER — Ambulatory Visit (INDEPENDENT_AMBULATORY_CARE_PROVIDER_SITE_OTHER): Payer: 59 | Admitting: Family Medicine

## 2018-08-27 ENCOUNTER — Encounter: Payer: Self-pay | Admitting: Family Medicine

## 2018-08-27 DIAGNOSIS — F411 Generalized anxiety disorder: Secondary | ICD-10-CM

## 2018-08-27 MED ORDER — CLONAZEPAM 0.5 MG PO TABS: 0.2500 mg | ORAL_TABLET | Freq: Two times a day (BID) | ORAL | 0 refills | Status: DC | PRN

## 2018-08-27 MED ORDER — SERTRALINE HCL 50 MG PO TABS: 50.0000 mg | ORAL_TABLET | Freq: Every day | ORAL | 1 refills | Status: DC

## 2018-08-27 NOTE — Progress Notes (Signed)
Virtual Visit via Telephone Note  I connected with Robin Cox on 08/27/18 at 11:17 AM by telephone and verified that I am speaking with the correct person using two identifiers.   I discussed the limitations, risks, security and privacy concerns of performing an evaluation and management service by telephone and the availability of in person appointments. I also discussed with the patient that there may be a patient responsible charge related to this service. The patient expressed understanding and agreed to proceed, consent obtained  Chief complaint: anxiety  History of Present Illness:  Anxiety: Last discussed with Dr. Linna Darner in September 2019.  Noted some panic sensation with planning to go to work at that time, with anxiety attack symptoms of palpitations and extreme anxiety.  Difficulty with sleep due to that anxiety.  Denied other stressors in life at that time.  Started on sertraline 50 mg daily, Klonopin 0.5 mg twice daily as needed (did not take during workday). Did recommend 1 month follow-up visit at that time.  zoloft was doing well. Thought was doing well enough to stop meds- stopped in January. Had been doing ok, but anxiety started to flare up past few weeks. Unknown trigger. Similar anxiety feeling before going to work.some heart racing. more anxiety - anxiety attack 2 days ago. Enjoys work Investment banker, corporate).  No specific fears or difficulties with work.  Last met with counseling in December- EAP program through work. Off work past 2 days. Would like to start med and plan to RTW after a few days of meds.    Depression screen [facility] 2/9 08/27/2018 08/27/2018 02/19/2018 09/08/2015  Decreased Interest 0 2 2 0  Down, Depressed, Hopeless 0 1 2 0  PHQ - 2 Score 0 3 4 0  Altered sleeping - 0 3 -  Tired, decreased energy - 2 3 -  Change in appetite - 2 3 -  Feeling bad or failure about yourself  - 0 1 -  Trouble concentrating - 1 2 -  Moving slowly or fidgety/restless - 1 1 -   Suicidal thoughts - 0 0 -  PHQ-9 Score - 9 17 -  Difficult doing work/chores - Somewhat difficult Somewhat difficult -      Patient Active Problem List   Diagnosis Date Noted  . Anxiety state 02/19/2018  . Cholelithiasis and cholecystitis without obstruction 08/02/2015  . Obesity 08/03/2013  . Dehydration 02/22/2013  . Hypokalemia 02/22/2013  . Abdominal pain, left upper quadrant 02/22/2013  . Nausea alone 02/09/2013  . Anemia 12/08/2012  . Preop cardiovascular exam 10/27/2012  . Dyspnea 09/23/2012  . Cellulitis of leg, left 09/23/2012  . Arrhythmia, sinus node 06/16/2012  . OSA (obstructive sleep apnea) 06/16/2012   Past Medical History:  Diagnosis Date  . Allergy   . Anemia    pt on iron pills  . Depression   . GERD (gastroesophageal reflux disease)   . Headache(784.0)    h/a's prior to starting b/p medication - no problems since  . Hypertension    no meds since wt loss  . Morbid obesity ([place])   . Sleep apnea    no CPAP since wt loss   Past Surgical History:  Procedure Laterality Date  . CHOLECYSTECTOMY N/A 08/02/2015   Procedure: LAPAROSCOPIC CHOLECYSTECTOMY WITH INTRAOPERATIVE CHOLANGIOGRAM;  Surgeon: Excell Seltzer, MD;  Location: [facility];  Service: General;  Laterality: N/A;  . [place]  . ESOPHAGOGASTRODUODENOSCOPY N/A 12/30/2012   Procedure: ESOPHAGOGASTRODUODENOSCOPY (EGD);  Surgeon: Edward Jolly, MD;  [facility];  Service: General;  Laterality: N/A;  . LAPAROSCOPIC GASTRIC SLEEVE RESECTION N/A 12/30/2012   Procedure: LAPAROSCOPIC GASTRIC SLEEVE RESECTION;  Surgeon: Edward Jolly, MD;  [facility];  Service: General;  Laterality: N/A;  Laparoscopic Sleeve Gastrectomy    Allergies  Allergen Reactions  . Augmentin [Amoxicillin-Pot Clavulanate] Nausea And Vomiting  . Azithromycin Nausea And Vomiting    Got extremely hot    Prior to Admission medications   Medication Sig Start Date End  Date Taking? Authorizing Provider  loratadine (CLARITIN) 10 MG tablet Take 10 mg by mouth daily.   Yes [provider]  MULTIPLE VITAMIN PO Take by mouth.   Yes [provider]  clonazePAM (KLONOPIN) 1 MG tablet Take 1 tablet (1 mg total) by mouth 2 (two) times daily as needed for anxiety. Patient not taking: Reported on 08/27/2018 02/19/18   Posey Boyer, MD  sertraline (ZOLOFT) 50 MG tablet Take 1 tablet (50 mg total) by mouth daily. Patient not taking: Reported on 08/27/2018 02/19/18   Posey Boyer, MD   Social History   Socioeconomic History  . Marital status: Single    Spouse name: Not on file  . Number of children: 0  . Years of education: Not on file  . Highest education level: Not on file  Occupational History  . Not on file  Social Needs  . Financial resource strain: Not on file  . Food insecurity:    Worry: Not on file    Inability: Not on file  . Transportation needs:    Medical: Not on file    Non-medical: Not on file  Tobacco Use  . Smoking status: Never Smoker  . Smokeless tobacco: Never Used  Substance and Sexual Activity  . Alcohol use: Yes    Comment: Rare  . Drug use: No  . Sexual activity: Not Currently  Lifestyle  . Physical activity:    Days per week: Not on file    Minutes per session: Not on file  . Stress: Not on file  Relationships  . Social connections:    Talks on phone: Not on file    Gets together: Not on file    Attends religious service: Not on file    Active member of club or organization: Not on file    Attends meetings of clubs or organizations: Not on file    Relationship status: Not on file  . Intimate partner violence:    Fear of current or ex partner: Not on file    Emotionally abused: Not on file    Physically abused: Not on file    Forced sexual activity: Not on file  Other Topics Concern  . Not on file  Social History Narrative  . Not on file   Review of systems per HPI  Observations/Objective: Calm  on phone, normal response to questioning.  No distress.  Assessment and Plan: Anxiety state - Plan: sertraline (ZOLOFT) 50 MG tablet, clonazePAM (KLONOPIN) 0.5 MG tablet  -Recurrence of anxiety with anxiety attacks 2 days ago.  Out of work due to symptoms.  -Restart Zoloft 50 mg daily, clonazepam 0.25 to 0.5 mg twice daily as needed, potential side effects and risk discussed.  -Recommended restarting counseling through EAP program or I can provide additional numbers if needed  -Plan for FMLA paperwork - out 3/30 through 4/7, then up to 2 days per month if needed for flares.   -Follow-up by telemedicine  visit in 3 weeks  Follow Up Instructions: 3 weeks tele-med visit.  Sooner if needed   I discussed the assessment and treatment plan with the patient. The patient was provided an opportunity to ask questions and all were answered. The patient agreed with the plan and demonstrated an understanding of the instructions.   The patient was advised to call back or seek an in-person evaluation if the symptoms worsen or if the condition fails to improve as anticipated.  I provided  minutes of non-face-to-face time during this encounter.  Signed,   Merri Ray, MD [facility] [place].  08/27/18

## 2018-08-27 NOTE — Patient Instructions (Signed)
Good talking to you today.  I have restarted the Zoloft 50 mg once per day.  Clonazepam 1/2-1 up to twice per day, but that can cause sedation or dizziness.  Use only as needed.  I would recommend restarting therapy with previous counselor or let me know if you need other numbers.  Paperwork will be completed for your employer with plan to return next Wednesday.  Let me know if those plans change.  Follow-up in 3 weeks with another telemedicine visit likely at that time.  Please let me know if there are questions in the meantime.  Panic Attack A panic attack is a sudden episode of severe anxiety, fear, or discomfort that causes physical and emotional symptoms. The attack may be in response to something frightening, or it may occur for no known reason. Symptoms of a panic attack can be similar to symptoms of a heart attack or stroke. It is important to see your health care provider when you have a panic attack so that these conditions can be ruled out. A panic attack is a symptom of another condition. Most panic attacks go away with treatment of the underlying problem. If you have panic attacks often, you may have a condition called panic disorder. What are the causes? A panic attack may be caused by:  An extreme, life-threatening situation, such as a war or natural disaster.  An anxiety disorder, such as post-traumatic stress disorder.  Depression.  Certain medical conditions, including heart problems, neurological conditions, and infections.  Certain over-the-counter and prescription medicines.  Illegal drugs that increase heart rate and blood pressure, such as methamphetamine.  Alcohol.  Supplements that increase anxiety.  Panic disorder. What increases the risk? You are more likely to develop this condition if:  You have an anxiety disorder.  You have another mental health condition.  You take certain medicines.  You use alcohol, illegal drugs, or other  substances.  You are under extreme stress.  A life event is causing increased feelings of anxiety and depression. What are the signs or symptoms? A panic attack starts suddenly, usually lasts about 20 minutes, and occurs with one or more of the following:  A pounding heart.  A feeling that your heart is beating irregularly or faster than normal (palpitations).  Sweating.  Trembling or shaking.  Shortness of breath or feeling smothered.  Feeling choked.  Chest pain or discomfort.  Nausea or a strange feeling in your stomach.  Dizziness, feeling lightheaded, or feeling like you might faint.  Chills or hot flashes.  Numbness or tingling in your lips, hands, or feet.  Feeling confused, or feeling that you are not yourself.  Fear of losing control or being emotionally unstable.  Fear of dying. How is this diagnosed? A panic attack is diagnosed with an assessment by your health care provider. During the assessment your health care provider will ask questions about:  Your history of anxiety, depression, and panic attacks.  Your medical history.  Whether you drink alcohol, use illegal drugs, take supplements, or take medicines. Be honest about your substance use. Your health care provider may also:  Order blood tests or other kinds of tests to rule out serious medical conditions.  Refer you to a mental health professional for further evaluation. How is this treated? Treatment depends on the cause of the panic attack:  If the cause is a medical problem, your health care provider will either treat that problem or refer you to a specialist.  If  the cause is emotional, you may be given anti-anxiety medicines or referred to a counselor. These medicines may reduce how often attacks happen, reduce how severe the attacks are, and lower anxiety.  If the cause is a medicine, your health care provider may tell you to stop the medicine, change your dose, or take a different  medicine.  If the cause is a drug, treatment may involve letting the drug wear off and taking medicine to help the drug leave your body or to counteract its effects. Attacks caused by drug abuse may continue even if you stop using the drug. Follow these instructions at home:  Take over-the-counter and prescription medicines only as told by your health care provider.  If you feel anxious, limit your caffeine intake.  Take good care of your physical and mental health by: ? Eating a balanced diet that includes plenty of fresh fruits and vegetables, whole grains, lean meats, and low-fat dairy. ? Getting plenty of rest. Try to get 7-8 hours of uninterrupted sleep each night. ? Exercising regularly. Try to get 30 minutes of physical activity at least 5 days a week. ? Not smoking. Talk to your health care provider if you need help quitting. ? Limiting alcohol intake to no more than 1 drink a day for nonpregnant women and 2 drinks a day for men. One drink equals 12 oz of beer, 5 oz of wine, or 1 oz of hard liquor.  Keep all follow-up visits as told by your health care provider. This is important. Panic attacks may have underlying physical or emotional problems that take time to accurately diagnose. Contact a health care provider if:  Your symptoms do not improve, or they get worse.  You are not able to take your medicine as prescribed because of side effects. Get help right away if:  You have serious thoughts about hurting yourself or others.  You have symptoms of a panic attack. Do not drive yourself to the hospital. Have someone else drive you or call an ambulance. If you ever feel like you may hurt yourself or others, or you have thoughts about taking your own life, get help right away. You can go to your nearest emergency department or call:  Your local emergency services (911 in the [place]  A suicide crisis helpline, such as the [place] at 682 880 7066.  This is open 24 hours a day. Summary  A panic attack is a sign of a serious health or mental health condition. Get help right away. Do not drive yourself to the hospital. Have someone else drive you or call an ambulance.  Always see a health care provider to have the reasons for the panic attack correctly diagnosed.  If your panic attack was caused by a physical problem, follow your health care provider's suggestions for medicine, referral to a specialist, and lifestyle changes.  If your panic attack was caused by an emotional problem, follow through with counseling from a qualified mental health specialist.  If you feel like you may hurt yourself or others, call 911 and get help right away. This information is not intended to replace advice given to you by your health care provider. Make sure you discuss any questions you have with your health care provider. Document Released: 05/14/2005 Document Revised: 06/22/2016 Document Reviewed: 06/22/2016 Elsevier Interactive Patient Education  [employer].    If you have lab work done today you will be contacted with your lab results within the next 2 weeks.  If you have not heard from [place] then please contact us. The fastest way to get your results is to register for My Chart.   IF you received an x-ray today, you will receive an invoice from [facility]. Please contact [facility] at [phone] with questions or concerns regarding your invoice.   IF you received labwork today, you will receive an invoice from [place]. Please contact [facility] at [phone] with questions or concerns regarding your invoice.   Our billing staff will not be able to assist you with questions regarding bills from these companies.  You will be contacted with the lab results as soon as they are available. The fastest way to get your results is to activate your My Chart account. Instructions are located on the last page of this paperwork. If  you have not heard from [place] regarding the results in 2 weeks, please contact this office.

## 2018-08-27 NOTE — Telephone Encounter (Signed)
LVM FOR PATIENT TO CALL BACK TO SEE IF SHE NEEDS TO COME IN OFFICE FOR FMLA

## 2018-09-03 ENCOUNTER — Telehealth: Payer: Self-pay | Admitting: Family Medicine

## 2018-09-03 NOTE — Telephone Encounter (Signed)
Copied from [place] [id number]. Topic: Quick Communication - See Telephone Encounter >> Sep 03, 2018  7:24 AM Robina Ade, Helene Kelp D wrote: CRM for notification. See Telephone encounter for: 09/03/18. Patient called and would like to talk to Dr. Carlota Raspberry about her FMLA paperwork. Her employer has not received them yet and patient stated that she is not feeling well. Patient has to be back to work today at [employer] but would like to miss work due to her illness. Please call patient back, thanks.

## 2018-09-03 NOTE — Telephone Encounter (Signed)
Pt calling again to ask about her FMLA paperwork. She also notes that she started taking sertraline and clonazepam again on Saturday. They make her a bit drowsy as well. Pt feel like she is needing a note to write out of work longer. She changed to taking medication in this morning and it has been drowsy today and cannot go to work at [employer]. Please advise.

## 2018-09-04 NOTE — Telephone Encounter (Signed)
Copied from [place] [id number]. Topic: Quick Communication - See Telephone Encounter >> Sep 04, 2018 11:33 AM Loma Boston wrote: CRM for notification. See Telephone encounter for: 09/04/18.   09/03/18 3:29 PM  Note   Pt is calling agin today for the 3rd time,  She is not able to work and is concerned about med are at too high of a dose. Her note was first addressing her paperwork but she is still noy feeling well and needs advice from Dr Darnell Level or his nurse. Please call today because the pt is not able to work. She is very tired and can not even wake up. Please call at [phone]  Pt calling again to ask about her FMLA paperwork. She also notes that she started taking sertraline and clonazepam again on Saturday. They make her a bit drowsy as well. Pt feel like she is needing a note to write out of work longer. She changed to taking medication in this morning and it has been drowsy today and cannot go to work at [employer]. Please advise.

## 2018-09-05 NOTE — Telephone Encounter (Signed)
Patient calling and states that she has note heard back regarding her work note or the medication. Robin Cox like a call today to go over this. Please advise.

## 2018-09-05 NOTE — Telephone Encounter (Signed)
Patient will schedule an telemed visit with Dr Carlota Raspberry on 09/08/18 or 09/09/18

## 2018-09-05 NOTE — Telephone Encounter (Signed)
Please advise as how you want to handle the note going back to work.

## 2018-09-05 NOTE — Telephone Encounter (Signed)
Copied from [place] [id number]. Topic: General - Inquiry >> Sep 02, 2018  9:26 AM Scherrie Gerlach wrote: Reason for CRM: pt calling to ask if her FMLA papers got in to her employer?  She is going back to work tomorrow, and if they are not there, she will need a work note.

## 2018-09-05 NOTE — Telephone Encounter (Signed)
Have placed form at nurse's station.

## 2018-09-05 NOTE — Telephone Encounter (Signed)
Robin Cox take a week or so to get adjusted to the medication, but typically do not see significant sedation with sertraline.  The clonazepam causes sedation so should only be using that as needed only, and can take half a pill at a time if needed. We can extend her work note through next week, but please schedule telemedicine visit sometime next week so we can discuss symptoms further.  I expect her to be improving with cutting back on use of clonazepam. .

## 2018-09-05 NOTE — Telephone Encounter (Signed)
Pt's fmla paperwork is in Dr. Vonna Kotyk box. She needs this filled out and faxed to her job. Check phone messages from 4/1 and 4/8

## 2018-09-09 ENCOUNTER — Telehealth: Payer: Self-pay | Admitting: Family Medicine

## 2018-09-09 ENCOUNTER — Telehealth (INDEPENDENT_AMBULATORY_CARE_PROVIDER_SITE_OTHER): Payer: 59 | Admitting: Family Medicine

## 2018-09-09 ENCOUNTER — Other Ambulatory Visit: Payer: Self-pay

## 2018-09-09 DIAGNOSIS — F411 Generalized anxiety disorder: Secondary | ICD-10-CM | POA: Diagnosis not present

## 2018-09-09 NOTE — Patient Instructions (Addendum)
I do recommend meeting with counselor. If unable to obtain counseling through EAP program, here is another number.    [facility]: [phone]  Out of work for 1 more week if needed- letter with return on 4/24.  1/2 pill of klonopin if needed only. Continue same dose zoloft for now.     If you have lab work done today you will be contacted with your lab results within the next 2 weeks.  If you have not heard from [place] then please contact us. The fastest way to get your results is to register for My Chart.   IF you received an x-ray today, you will receive an invoice from [facility]. Please contact [facility] at [phone] with questions or concerns regarding your invoice.   IF you received labwork today, you will receive an invoice from [place]. Please contact [facility] at [phone] with questions or concerns regarding your invoice.   Our billing staff will not be able to assist you with questions regarding bills from these companies.  You will be contacted with the lab results as soon as they are available. The fastest way to get your results is to activate your My Chart account. Instructions are located on the last page of this paperwork. If you have not heard from [place] regarding the results in 2 weeks, please contact this office.

## 2018-09-09 NOTE — Telephone Encounter (Signed)
09/09/2018 - PATIENT HAD A TELEMED VISIT WITH DR. Carlota Raspberry ON Tuesday 09/09/2018. DR. Carlota Raspberry REQUESTED SHE RETURN FOR A 2 WEEK TELEMED VISIT AGAIN. I HAD TO LEAVE HER A MESSAGE ON HER VOICE MAIL TO CALL BACK AND SCHEDULE. Robin Cox

## 2018-09-09 NOTE — Progress Notes (Signed)
CC- F/u Anxiety- still feeling anxious - Not sure if able to go back to work yet but will see what Dr Carlota Raspberry thinks. Have been taking medication but make sleepy.  FMLA for is in your inbox for you to sign. I filled out what I could not sure if it is right but I done the best I could. Patient is due to go back to work tomorrow.

## 2018-09-09 NOTE — Progress Notes (Signed)
Virtual Visit via Telephone Note  I connected with Robin Cox on 09/09/18 at 4:59 PM by telephone and verified that I am speaking with the correct person using two identifiers.   I discussed the limitations, risks, security and privacy concerns of performing an evaluation and management service by telephone and the availability of in person appointments. I also discussed with the patient that there may be a patient responsible charge related to this service. The patient expressed understanding and agreed to proceed, consent obtained  Chief complaint:  Anxiety   History of Present Illness:  Anxiety: See prior visit on April 1.  Had some anxiety symptoms in September 2019.  Was started on sertraline 50 mg and Klonopin as needed last year.  Zoloft was working well, stopped in January as thought her anxiety had improved.  Anxiety had flared up the previous few weeks prior to April 1 visit.  Unknown triggers.  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  Had not met with counseling since December when she met with her EAP program.  At last visit planned for 2 days off of work to start medication and then plan to return to work.  Restarted Zoloft 50 mg daily, Klonopin 0.25 to 0.5 mg twice daily as needed but side effects were discussed.  Also recommended restarting counseling through EAP or other numbers to be provided if needed  Telephone note April 8 -was having some sedation at that time, note was extended for her work, [employer] paperwork pending.  Also taking zyrtec otc for allergies.  Taking zoloft 85m QD.  Initially was taking klonopin during the day - full pill.  Had sedation. Last took yesterday. Has not yet met with therapist, [place] Friday[place]  Still some anxiety - not full blown, but anxious prior to going to work. Last went to work  3/30.  Scheduled to return this Friday. Not sure if ready - still having anxiety about going in.  No SI/HI.   [facility] 08/27/2018 08/27/2018 02/19/2018 09/08/2015  Decreased Interest 0 2 2 0  Down, Depressed, Hopeless 0 1 2 0  PHQ - 2 Score 0 3 4 0  Altered sleeping - 0 3 -  Tired, decreased energy - 2 3 -  Change in appetite - 2 3 -  Feeling bad or failure about yourself  - 0 1 -  Trouble concentrating - 1 2 -  Moving slowly or fidgety/restless - 1 1 -  Suicidal thoughts - 0 0 -  PHQ-9 Score - 9 17 -  Difficult doing work/chores - Somewhat difficult Somewhat difficult -      Patient Active Problem List   Diagnosis Date Noted  . Anxiety state 02/19/2018  . Cholelithiasis and cholecystitis without obstruction 08/02/2015  . Obesity 08/03/2013  . Dehydration 02/22/2013  . Hypokalemia 02/22/2013  . Abdominal pain, left upper quadrant 02/22/2013  . Nausea alone 02/09/2013  . Anemia 12/08/2012  . Preop cardiovascular exam 10/27/2012  . Dyspnea 09/23/2012  . Cellulitis of leg, left 09/23/2012  . Arrhythmia, sinus node 06/16/2012  . OSA (obstructive sleep apnea) 06/16/2012   Past Medical History:  Diagnosis Date  . Allergy   . Anemia    pt on iron pills  . Depression   . GERD (gastroesophageal reflux disease)   . Headache(784.0)  h/a's prior to starting b/p medication - no problems since  . Hypertension    no meds since wt loss  . Morbid obesity ([place])   . Sleep apnea    no CPAP since wt loss   Past Surgical History:  Procedure Laterality Date  . CHOLECYSTECTOMY N/A 08/02/2015   Procedure: LAPAROSCOPIC CHOLECYSTECTOMY WITH INTRAOPERATIVE CHOLANGIOGRAM;  Surgeon: Excell Seltzer, MD;  Location: [place];  Service: General;  Laterality: N/A;  . [place]  . ESOPHAGOGASTRODUODENOSCOPY N/A 12/30/2012   Procedure: ESOPHAGOGASTRODUODENOSCOPY (EGD);  Surgeon: Edward Jolly, MD;  Location: [facility];  Service: General;   Laterality: N/A;  . LAPAROSCOPIC GASTRIC SLEEVE RESECTION N/A 12/30/2012   Procedure: LAPAROSCOPIC GASTRIC SLEEVE RESECTION;  Surgeon: Edward Jolly, MD;  Location: [facility];  Service: General;  Laterality: N/A;  Laparoscopic Sleeve Gastrectomy    Allergies  Allergen Reactions  . Augmentin [Amoxicillin-Pot Clavulanate] Nausea And Vomiting  . Azithromycin Nausea And Vomiting    Got extremely hot    Prior to Admission medications   Medication Sig Start Date End Date Taking? Authorizing Provider  clonazePAM (KLONOPIN) 0.5 MG tablet Take 0.5-1 tablets (0.25-0.5 mg total) by mouth 2 (two) times daily as needed for anxiety. 08/27/18  Yes Wendie Agreste, MD  loratadine (CLARITIN) 10 MG tablet Take 10 mg by mouth daily.   Yes [provider]  MULTIPLE VITAMIN PO Take by mouth.   Yes [provider]  sertraline (ZOLOFT) 50 MG tablet Take 1 tablet (50 mg total) by mouth daily. 08/27/18  Yes Wendie Agreste, MD   Social History   Socioeconomic History  . Marital status: Single    Spouse name: Not on file  . Number of children: 0  . Years of education: Not on file  . Highest education level: Not on file  Occupational History  . Not on file  Social Needs  . Financial resource strain: Not on file  . Food insecurity:    Worry: Not on file    Inability: Not on file  . Transportation needs:    Medical: Not on file    Non-medical: Not on file  Tobacco Use  . Smoking status: Never Smoker  . Smokeless tobacco: Never Used  Substance and Sexual Activity  . Alcohol use: Yes    Comment: Rare  . Drug use: No  . Sexual activity: Not Currently  Lifestyle  . Physical activity:    Days per week: Not on file    Minutes per session: Not on file  . Stress: Not on file  Relationships  . Social connections:    Talks on phone: Not on file    Gets together: Not on file    Attends religious service: Not on file    Active member of club or organization: Not on file    Attends  meetings of clubs or organizations: Not on file    Relationship status: Not on file  . Intimate partner violence:    Fear of current or ex partner: Not on file    Emotionally abused: Not on file    Physically abused: Not on file    Forced sexual activity: Not on file  Other Topics Concern  . Not on file  Social History Narrative  . Not on file     Observations/Objective: Speaking normally without distress on phone.  Normal responses, no SI/HI.  Assessment and Plan: Anxiety state  -Sedation previously with Klonopin full dose likely,  as well as potentially with Zyrtec.  Trial of half dose Klonopin only if needed for significant anxiety symptoms to see if that is just as effective.  Counseling recommended, through EAP or other number provided.  Continue Zoloft same dose for now, out of work extended through next Friday, but can return sooner if symptoms have improved.  Will review paperwork.  Follow-up tele-med visit 2 weeks.   Follow Up Instructions: Patient Instructions   I do recommend meeting with counselor. If unable to obtain counseling through EAP program, here is another number.  Out of work for 1 more week if needed- letter with return on 4/24.  1/2 pill of klonopin if needed only. Continue same dose zoloft for now.   If you have lab work done today you will be contacted with your lab results within the next 2 weeks.  If you have not heard from [place] then please contact us. The fastest way to get your results is to register for My Chart.   IF you received an x-ray today, you will receive an invoice from [facility]. Please contact [facility] at [phone] with questions or concerns regarding your invoice.   IF you received labwork today, you will receive an invoice from [place]. Please contact [facility] at [phone] with questions or concerns regarding your invoice.   Our billing staff will not be able to assist you with questions regarding bills from  these companies.  You will be contacted with the lab results as soon as they are available. The fastest way to get your results is to activate your My Chart account. Instructions are located on the last page of this paperwork. If you have not heard from [place] regarding the results in 2 weeks, please contact this office.          I discussed the assessment and treatment plan with the patient. The patient was provided an opportunity to ask questions and all were answered. The patient agreed with the plan and demonstrated an understanding of the instructions.   The patient was advised to call back or seek an in-person evaluation if the symptoms worsen or if the condition fails to improve as anticipated.  I provided 11 minutes of non-face-to-face time during this encounter.  Signed,   Merri Ray, MD [facility].  09/09/18

## 2018-09-11 ENCOUNTER — Telehealth: Payer: Self-pay | Admitting: Family Medicine

## 2018-09-11 NOTE — Telephone Encounter (Signed)
I called pt and informed her that I have faxed her fmla paperwork to [phone] and got a confirmation. I also made an upcoming app for her with Dr. Carlota Raspberry on  09/17/18.

## 2018-09-15 NOTE — Telephone Encounter (Signed)
Robin Cox with FMLA called and said that she needs the dates for this. Call back @ [phone]

## 2018-09-17 ENCOUNTER — Other Ambulatory Visit: Payer: Self-pay

## 2018-09-17 ENCOUNTER — Telehealth: Payer: Self-pay | Admitting: Family Medicine

## 2018-09-17 ENCOUNTER — Telehealth (INDEPENDENT_AMBULATORY_CARE_PROVIDER_SITE_OTHER): Payer: 59 | Admitting: Family Medicine

## 2018-09-17 DIAGNOSIS — F411 Generalized anxiety disorder: Secondary | ICD-10-CM | POA: Diagnosis not present

## 2018-09-17 NOTE — Telephone Encounter (Signed)
09/17/2018 - PATIENT HAD A TELEMED VISIT SCHEDULED WITH DR. Carlota Raspberry ON Wednesday 09/17/2018. HE HAS REQUESTED THE PATIENT RETURN IN 2 WEEKS FOR A FOLLOW-UP VISIT ON HER DEPRESSION. I TRIED TO MAKE THIS APPOINTMENT BUT I HAD TO LEAVE HER A VOICE MAIL. [place]

## 2018-09-17 NOTE — Telephone Encounter (Signed)
I called [phone] and I left VM to  Her stating-the  Paperwork has been faxed to her office 6 days ago. I did get a confirmation.

## 2018-09-17 NOTE — Patient Instructions (Addendum)
See information on stress management below -make sure to have a plan for coping techniques or stress management techniques for when you return to work.  As we discussed slow breathing and mindfulness can help during those times.   Continue Zoloft same dose for now, Klonopin only if needed and would recommend trying 1/2 pill initially to see if that is just as effective with less sedation.  Recheck in 2 weeks, sooner if any worsening of symptoms.  Take care.    Stress Stress is a normal reaction to life events. Stress is what you feel when life demands more than you are used to, or more than you think you can handle. Some stress can be useful, such as studying for a test or meeting a deadline at work. Stress that occurs too often or for too long can cause problems. It can affect your emotional health and interfere with relationships and normal daily activities. Too much stress can weaken your body's defense system (immune system) and increase your risk for physical illness. If you already have a medical problem, stress can make it worse. What are the causes? All sorts of life events can cause stress. An event that causes stress for one person may not be stressful for another person. Major life events, whether positive or negative, commonly cause stress. Examples include:  Losing a job or starting a new job.  Losing a loved one.  Moving to a new town or home.  Getting married or divorced.  Having a baby.  Injury or illness. Less obvious life events can also cause stress, especially if they occur day after day or in combination with each other. Examples include:  Working long hours.  Driving in traffic.  Caring for children.  Being in debt.  Being in a difficult relationship. What are the signs or symptoms? Stress can cause emotional symptoms, including:  Anxiety. This is feeling worried, afraid, on edge, overwhelmed, or out of control.  Anger, including irritation or  impatience.  Depression. This is feeling sad, down, helpless, or guilty.  Trouble focusing, remembering, or making decisions. Stress can cause physical symptoms, including:  Aches and pains. These may affect your head, neck, back, stomach, or other areas of your body.  Tight muscles or a clenched jaw.  Low energy.  Trouble sleeping. Stress can cause unhealthy behaviors, including:  Eating to feel better (overeating) or skipping meals.  Working too much or putting off tasks.  Smoking, drinking alcohol, or using drugs to feel better. How is this diagnosed? Stress is diagnosed through an assessment by your health care provider. He or she may diagnose this condition based on:  Your symptoms and any stressful life events.  Your medical history.  Tests to rule out other causes of your symptoms. Depending on your condition, your health care provider may refer you to a specialist for further evaluation. How is this treated?  Stress management techniques are the recommended treatment for stress. Medicine is not typically recommended for the treatment of stress. Techniques to reduce your reaction to stressful life events include:  Stress identification. Monitor yourself for symptoms of stress and identify what causes stress for you. These skills may help you to avoid or prepare for stressful events.  Time management. Set your priorities, keep a calendar of events, and learn to say "no." Taking these actions can help you avoid making too many commitments. Techniques for coping with stress include:  Rethinking the problem. Try to think realistically about stressful events rather than  ignoring them or overreacting. Try to find the positives in a stressful situation rather than focusing on the negatives.  Exercise. Physical exercise can release both physical and emotional tension. The key is to find a form of exercise that you enjoy and do it regularly.  Relaxation techniques. These  relax the body and mind. The key is to find one or more that you enjoy and use the technique(s) regularly. Examples include: ? Meditation, deep breathing, or progressive relaxation techniques. ? Yoga or tai chi. ? Biofeedback, mindfulness techniques, or journaling. ? Listening to music, being out in nature, or participating in other hobbies.  Practicing a healthy lifestyle. Eat a balanced diet, drink plenty of water, limit or avoid caffeine, and get plenty of sleep.  Having a strong support network. Spend time with family, friends, or other people you enjoy being around. Express your feelings and talk things over with someone you trust. Counseling or talk therapy with a mental health professional may be helpful if you are having trouble managing stress on your own. Follow these instructions at home: Lifestyle   Avoid drugs.  Do not use any products that contain nicotine or tobacco, such as cigarettes and e-cigarettes. If you need help quitting, ask your health care provider.  Limit alcohol intake to no more than 1 drink a day for nonpregnant women and 2 drinks a day for men. One drink equals 12 oz of beer, 5 oz of wine, or 1 oz of hard liquor.  Do not use alcohol or drugs to relax.  Eat a balanced diet that includes fresh fruits and vegetables, whole grains, lean meats, fish, eggs, and beans, and low-fat dairy. Avoid processed foods and foods high in added fat, sugar, and salt.  Exercise at least 30 minutes on 5 or more days each week.  Get 7-8 hours of sleep each night. General instructions   Practice stress management techniques as discussed with your health care provider.  Drink enough fluid to keep your urine clear or pale yellow.  Take over-the-counter and prescription medicines only as told by your health care provider.  Keep all follow-up visits as told by your health care provider. This is important. Contact a health care provider if:  Your symptoms get worse.  You  have new symptoms.  You feel overwhelmed by your problems and can no longer manage them on your own. Get help right away if:  You have thoughts of hurting yourself or others. If you ever feel like you may hurt yourself or others, or have thoughts about taking your own life, get help right away. You can go to your nearest emergency department or call:  Your local emergency services (911 in the [place]  A suicide crisis helpline, such as the [place] at 718-756-7735. This is open 24 hours a day. Summary  Stress is a normal reaction to life events. It can cause problems if it happens too often or for too long.  Practicing stress management techniques is the best way to treat stress.  Counseling or talk therapy with a mental health professional may be helpful if you are having trouble managing stress on your own. This information is not intended to replace advice given to you by your health care provider. Make sure you discuss any questions you have with your health care provider. Document Released: 11/07/2000 Document Revised: 07/04/2016 Document Reviewed: 07/04/2016 Elsevier Interactive Patient Education  [employer].     If you have lab work done today you  will be contacted with your lab results within the next 2 weeks.  If you have not heard from [place] then please contact us. The fastest way to get your results is to register for My Chart.   IF you received an x-ray today, you will receive an invoice from [facility]. Please contact [facility] at [phone] with questions or concerns regarding your invoice.   IF you received labwork today, you will receive an invoice from [facility]. Please contact [facility] at [phone] with questions or concerns regarding your invoice.   Our billing staff will not be able to assist you with questions regarding bills from these companies.  You will be contacted with the lab results as soon as  they are available. The fastest way to get your results is to activate your My Chart account. Instructions are located on the last page of this paperwork. If you have not heard from [place] regarding the results in 2 weeks, please contact this office.

## 2018-09-17 NOTE — Progress Notes (Addendum)
Virtual Visit via Telephone Note  I connected with [name] on 09/17/18 at 11:18 AM by telephone and verified that I am speaking with the correct person using two identifiers.   I discussed the limitations, risks, security and privacy concerns of performing an evaluation and management service by telephone and the availability of in person appointments. I also discussed with the patient that there may be a patient responsible charge related to this service. The patient expressed understanding and agreed to proceed, consent obtained  Chief complaint:  Anxiety  History of Present Illness: Robin Cox is a 48 y.o. female  Initial evaluation April 1.  Anxiety symptoms September 2019, started on sertraline 50 mg and Klonopin as needed.  Works as a Designer, industrial/product.  Had increasing anxiety in the few weeks prior to April 1 visit with unknown trigger.  Had been unable to return to work due to the symptoms.  Started on Zoloft and Klonopin as needed.  Recommended counseling.  Most recent follow-up April 14, had some sedation with Klonopin, but was tolerating Zoloft.  Additionally had been taking Zyrtec for allergies which may have also contributed to sedation.  Had not yet met with EAP/counselor.  Still some anxiety, not as severe as prior but still anxious prior to getting to work.  Was not ready to return on planned scheduled shift On the 17th.  Next scheduled shift 27th.  Doing ok. Feel a little down few days ago, but overall feeling better.  Still on zoloft 59m qd.  Less sedation. Still on zyrtec for allergies. Only one klonopin (full pill) since last visit.  Scheduled to RTW this Monday. Did not meet with therapist, still plans on trying to schedule this for next month.  Coping technique change:  pepping self up, encouraging self, trying to stay on same schedule as if working. 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    [facility]2018  08/27/2018 08/27/2018 02/19/2018 09/08/2015  Decreased Interest 0 0 2 2 0  Down, Depressed, Hopeless 1 0 1 2 0  PHQ - 2 Score 1 0 3 4 0  Altered sleeping 0 - 0 3 -  Tired, decreased energy 1 - 2 3 -  Change in appetite 0 - 2 3 -  Feeling bad or failure about yourself  0 - 0 1 -  Trouble concentrating 0 - 1 2 -  Moving slowly or fidgety/restless 0 - 1 1 -  Suicidal thoughts 0 - 0 0 -  PHQ-9 Score 2 - 9 17 -  Difficult doing work/chores Not difficult at all - Somewhat difficult Somewhat difficult -      Patient Active Problem List   Diagnosis Date Noted  . Anxiety state 02/19/2018  . Cholelithiasis and cholecystitis without obstruction 08/02/2015  . Obesity 08/03/2013  . Dehydration 02/22/2013  . Hypokalemia 02/22/2013  . Abdominal pain, left upper quadrant 02/22/2013  . Nausea alone 02/09/2013  . Anemia 12/08/2012  . Preop cardiovascular exam 10/27/2012  . Dyspnea 09/23/2012  . Cellulitis of leg, left 09/23/2012  . Arrhythmia, sinus node 06/16/2012  . OSA (obstructive sleep apnea) 06/16/2012   Past Medical History:  Diagnosis Date  . Allergy   . Anemia    pt on iron pills  . Depression   . GERD (gastroesophageal reflux disease)   . Headache(784.0)    h/a's prior to starting b/p medication - no problems since  . Hypertension    no meds since wt  loss  . Morbid obesity ([place])   . Sleep apnea    no CPAP since wt loss   Past Surgical History:  Procedure Laterality Date  . CHOLECYSTECTOMY N/A 08/02/2015   Procedure: LAPAROSCOPIC CHOLECYSTECTOMY WITH INTRAOPERATIVE CHOLANGIOGRAM;  Surgeon: Excell Seltzer, MD;  Location: [facility];  Service: General;  Laterality: N/A;  . [place]  . ESOPHAGOGASTRODUODENOSCOPY N/A 12/30/2012   Procedure: ESOPHAGOGASTRODUODENOSCOPY (EGD);  Surgeon: Edward Jolly, MD;  Location: [facility];  Service: General;  Laterality: N/A;  . LAPAROSCOPIC GASTRIC SLEEVE RESECTION N/A 12/30/2012   Procedure:  LAPAROSCOPIC GASTRIC SLEEVE RESECTION;  Surgeon: Edward Jolly, MD;  Location: [facility];  Service: General;  Laterality: N/A;  Laparoscopic Sleeve Gastrectomy    Allergies  Allergen Reactions  . Augmentin [Amoxicillin-Pot Clavulanate] Nausea And Vomiting  . Azithromycin Nausea And Vomiting    Got extremely hot    Prior to Admission medications   Medication Sig Start Date End Date Taking? Authorizing Provider  clonazePAM (KLONOPIN) 0.5 MG tablet Take 0.5-1 tablets (0.25-0.5 mg total) by mouth 2 (two) times daily as needed for anxiety. 08/27/18  Yes Wendie Agreste, MD  loratadine (CLARITIN) 10 MG tablet Take 10 mg by mouth daily.   Yes [provider]  MULTIPLE VITAMIN PO Take by mouth.   Yes [provider]  sertraline (ZOLOFT) 50 MG tablet Take 1 tablet (50 mg total) by mouth daily. 08/27/18  Yes Wendie Agreste, MD   Social History   Socioeconomic History  . Marital status: Single    Spouse name: Not on file  . Number of children: 0  . Years of education: Not on file  . Highest education level: Not on file  Occupational History  . Not on file  Social Needs  . Financial resource strain: Not on file  . Food insecurity:    Worry: Not on file    Inability: Not on file  . Transportation needs:    Medical: Not on file    Non-medical: Not on file  Tobacco Use  . Smoking status: Never Smoker  . Smokeless tobacco: Never Used  Substance and Sexual Activity  . Alcohol use: Yes    Comment: Rare  . Drug use: No  . Sexual activity: Not Currently  Lifestyle  . Physical activity:    Days per week: Not on file    Minutes per session: Not on file  . Stress: Not on file  Relationships  . Social connections:    Talks on phone: Not on file    Gets together: Not on file    Attends religious service: Not on file    Active member of club or organization: Not on file    Attends meetings of clubs or organizations: Not on file    Relationship status: Not on file  .  Intimate partner violence:    Fear of current or ex partner: Not on file    Emotionally abused: Not on file    Physically abused: Not on file    Forced sexual activity: Not on file  Other Topics Concern  . Not on file  Social History Narrative  . Not on file     Observations/Objective: Calm interactions on phone, normal responses, no distress, no SI  Assessment and Plan: Anxiety state  -Improving with Zoloft.  Ready to return to work at scheduled shift on the 27th.    - Stress management and coping techniques were discussed including mindfulness,  breathing techniques when feeling stressed or overwhelmed at work.   - Half dose of Klonopin if needed may be less sedating but has not required recently.  Continue Zoloft same dose.  Plans for eventual meeting with therapist.   - Follow-up in 2 weeks.  Sooner if worse  Follow Up Instructions: 2 weeks.    I discussed the assessment and treatment plan with the patient. The patient was provided an opportunity to ask questions and all were answered. The patient agreed with the plan and demonstrated an understanding of the instructions.   The patient was advised to call back or seek an in-person evaluation if the symptoms worsen or if the condition fails to improve as anticipated.  I provided 7 minutes of non-face-to-face time during this encounter.  Signed,   Merri Ray, MD [facility].  09/17/18

## 2018-09-17 NOTE — Progress Notes (Signed)
CC- Depression- Patient stated she is feeling much better now. Medication has been helping. Patient is schedule to return back to work on 09/22/2018. FMLA form was in your box to be filled out. Not sure if they have been faxed to patient's work.

## 2018-09-17 NOTE — Telephone Encounter (Signed)
See below

## 2018-09-24 NOTE — Telephone Encounter (Signed)
I spoke with Sabino Donovan and gave her the info she needed.

## 2018-09-25 ENCOUNTER — Telehealth: Payer: Self-pay | Admitting: Family Medicine

## 2018-09-25 NOTE — Telephone Encounter (Signed)
Dr Carlota Raspberry pt's fmla paperwork in chart review under media tab- date 09/09/18.    08/27/18-09/19/18 or 09/24/18. Is it 24 of 29. Send back message and I will handle.

## 2018-09-25 NOTE — Telephone Encounter (Signed)
Based on last visit, planned return to work April 27, so note was provided through the 24th.  If that needs to be addended to the 26th with return to work on the 27th that is fine.  Let me know if other questions.  Thanks.

## 2018-09-26 NOTE — Telephone Encounter (Signed)
I spoke with Robin Cox and she said the paperwork was complete.

## 2019-02-25 ENCOUNTER — Telehealth: Payer: Self-pay

## 2019-02-25 NOTE — Telephone Encounter (Signed)
Called pt to follow-up about FMLA, LVM to call office back if she needs this paper work filled out again or if she needed [place] to do anything else at this time.

## 2019-05-20 ENCOUNTER — Other Ambulatory Visit: Payer: 59

## 2019-05-20 ENCOUNTER — Other Ambulatory Visit: Payer: Self-pay

## 2022-05-02 ENCOUNTER — Other Ambulatory Visit: Payer: Self-pay

## 2022-05-02 ENCOUNTER — Encounter (HOSPITAL_BASED_OUTPATIENT_CLINIC_OR_DEPARTMENT_OTHER): Payer: Self-pay

## 2022-05-02 ENCOUNTER — Ambulatory Visit: Payer: Self-pay | Admitting: *Deleted

## 2022-05-02 ENCOUNTER — Emergency Department (HOSPITAL_BASED_OUTPATIENT_CLINIC_OR_DEPARTMENT_OTHER)
Admission: EM | Admit: 2022-05-02 | Discharge: 2022-05-02 | Disposition: A | Discharge status: Home / Self Care | Payer: 59 | Attending: Emergency Medicine | Admitting: Emergency Medicine

## 2022-05-02 DIAGNOSIS — R519 Headache, unspecified: Secondary | ICD-10-CM | POA: Diagnosis present

## 2022-05-02 DIAGNOSIS — R110 Nausea: Secondary | ICD-10-CM | POA: Diagnosis not present

## 2022-05-02 DIAGNOSIS — H9202 Otalgia, left ear: Secondary | ICD-10-CM | POA: Diagnosis not present

## 2022-05-02 DIAGNOSIS — I10 Essential (primary) hypertension: Secondary | ICD-10-CM | POA: Insufficient documentation

## 2022-05-02 DIAGNOSIS — J3489 Other specified disorders of nose and nasal sinuses: Secondary | ICD-10-CM | POA: Diagnosis not present

## 2022-05-02 DIAGNOSIS — H53149 Visual discomfort, unspecified: Secondary | ICD-10-CM | POA: Insufficient documentation

## 2022-05-02 DIAGNOSIS — Z20822 Contact with and (suspected) exposure to covid-19: Secondary | ICD-10-CM | POA: Diagnosis not present

## 2022-05-02 LAB — RESP PANEL BY RT-PCR (FLU A&B, COVID) ARPGX2
Influenza A by PCR: NEGATIVE
Influenza B by PCR: NEGATIVE
SARS Coronavirus 2 by RT PCR: NEGATIVE

## 2022-05-02 MED ORDER — KETOROLAC TROMETHAMINE 15 MG/ML IJ SOLN
15.0000 mg | Freq: Once | INTRAMUSCULAR | Status: AC
  Administered 2022-05-02: 15 mg via INTRAVENOUS
  Filled 2022-05-02: qty 1

## 2022-05-02 MED ORDER — ONDANSETRON HCL 4 MG/2ML IJ SOLN
4.0000 mg | Freq: Once | INTRAMUSCULAR | Status: AC
  Administered 2022-05-02: 4 mg via INTRAVENOUS
  Filled 2022-05-02: qty 2

## 2022-05-02 MED ORDER — SODIUM CHLORIDE 0.9 % IV BOLUS
1000.0000 mL | Freq: Once | INTRAVENOUS | Status: AC
  Administered 2022-05-02: 1000 mL via INTRAVENOUS

## 2022-05-02 NOTE — Telephone Encounter (Signed)
Reason for Disposition  [1] MODERATE headache (e.g., interferes with normal activities) AND [2] present > 24 hours AND [3] unexplained  (Exceptions: analgesics not tried, typical migraine, or headache part of viral illness)  Answer Assessment - Initial Assessment Questions 1. LOCATION: "Where does it hurt?"      I'm having bad headaches mainly in front and between my eyes.   In the mornings I have a headache in top of my head.   When I turn my head it hurts then it goes away during the day.   I feel nausea in the mornings with dizziness.   Having muscle spasms in my legs, arms, fingers, all over my body. 2. ONSET: "When did the headache start?" (Minutes, hours or days)      Since Thursday I've had the headaches.   I'm taking ibuprofen.    I'm having nasal congestion but not bad.   Sometimes it drains then sometimes it does not.      Early in the mornings when headache is intense I have visual changes. 3. PATTERN: "Does the pain come and go, or has it been constant since it started?"     Intermittent  4. SEVERITY: "How bad is the pain?" and "What does it keep you from doing?"  (e.g., Scale 1-10; mild, moderate, or severe)   - MILD (1-3): doesn't interfere with normal activities    - MODERATE (4-7): interferes with normal activities or awakens from sleep    - SEVERE (8-10): excruciating pain, unable to do any normal activities        Moderate in the mornings 5. RECURRENT SYMPTOM: "Have you ever had headaches before?" If Yes, ask: "When was the last time?" and "What happened that time?"      Not asked 6. CAUSE: "What do you think is causing the headache?"     I don't know 7. MIGRAINE: "Have you been diagnosed with migraine headaches?" If Yes, ask: "Is this headache similar?"      Not asked but she did not mention ever having migraines. 8. HEAD INJURY: "Has there been any recent injury to the head?"      Not asked 9. OTHER SYMPTOMS: "Do you have any other symptoms?" (fever, stiff neck, eye pain,  sore throat, cold symptoms)     Dizziness, visual changes, hurts to turn her head, nasal congestion and muscle spasms in various places on her  body 10. PREGNANCY: "Is there any chance you are pregnant?" "When was your last menstrual period?"       Not asked due to age  Protocols used: [facility]

## 2022-05-02 NOTE — ED Triage Notes (Addendum)
C/o headache and sinus pain since Thursday with nausea & left ear pain, nasal drainage.  Had stopped taking antihypertensives d/t weight loss, gained weight back. Used to get headaches prior to starting antihypertensives. Hypertensive in triage.

## 2022-05-02 NOTE — Discharge Instructions (Addendum)
It was a pleasure taking care of you today!   You may take over-the-counter 600 mg ibuprofen every 6 hours and alternate with 500 mg Tylenol every 6 hours as needed for your pain.  Attached is an patient for primary care providers in the area to establish care.  Ensure to maintain fluid intake.  Ensure to check your blood pressure at home and keep a log of your blood pressure findings.  Return to the Emergency Department if you are experiencing increasing/worsening symptoms.

## 2022-05-02 NOTE — Telephone Encounter (Signed)
  Chief Complaint: Called in on community line c/o headaches, dizziness, visual changes, nasal congestion, muscle spasms various areas of body and nausea with the headaches. Symptoms: above Frequency: Since last Thursday Pertinent Negatives: Patient denies recent URI or illnesses.  No coughing or sore throat. Disposition: '[x]'$ ED /'[]'$ Urgent Care (no appt availability in office) / '[]'$ Appointment(In office/virtual)/ '[]'$  [facility]/ '[]'$ Home Care/ '[]'$ Refused Recommended Disposition /'[]'$ [facility]/ '[]'$  Follow-up with PCP Additional Notes: Referred pt to the ED due to her symptoms instead of the urgent care.   She called in seeking information on which one to go to the ED or urgent care.   She's going to the [facility] ED now.  (She was sitting in car in front of Urgent Care on [facility] [address] when she called in).    "It's close to my house is reason I came here but I'm going to the ED on [address] now".

## 2022-05-02 NOTE — ED Provider Notes (Signed)
[facility] EMERGENCY DEPARTMENT Provider Note   CSN: [id number] Arrival date & time: 05/02/22  9937     History  Chief Complaint  Patient presents with   Headache    Robin Cox is a 51 y.o. female with a PMHx of HTN who presents to the ED with concerns for intermittent headache onset 6 days.  She notes that her headache resolved with ibuprofen use at the start of her symptoms however patient notes "I do not like taking a lot of medications." denies sick contacts. Has associated sinus pain, nausea, left ear pain, rhinorrhea, intermittent photophobia.  Notes that her headache is similar to her history of headaches. Notes that she stopped taking her antihypertensives due to losing weight, this was discontinued by herself.  Patient does not have a primary care provider at this time. Denies this being the worse headache of her life, thunder clap onset. Denies diplopia, numbness, tingling, weakness.   The history is provided by the patient. No language interpreter was used.       Home Medications Prior to Admission medications   Medication Sig Start Date End Date Taking? Authorizing Provider  clonazePAM (KLONOPIN) 0.5 MG tablet Take 0.5-1 tablets (0.25-0.5 mg total) by mouth 2 (two) times daily as needed for anxiety. 08/27/18   Wendie Agreste, MD  loratadine (CLARITIN) 10 MG tablet Take 10 mg by mouth daily.    [provider]  MULTIPLE VITAMIN PO Take by mouth.    [provider]  sertraline (ZOLOFT) 50 MG tablet Take 1 tablet (50 mg total) by mouth daily. 08/27/18   Wendie Agreste, MD      Allergies    Augmentin [amoxicillin-pot clavulanate] and Azithromycin    Review of Systems   Review of Systems  Neurological:  Positive for headaches.  All other systems reviewed and are negative.   Physical Exam Updated Vital Signs BP (!) 149/81   Pulse 70   Temp 97.8 F (36.6 C) (Oral)   Resp 16   Ht '5\' 1"'$  (1.549 m)   Wt 99.8 kg   LMP 04/19/2022  (Approximate)   SpO2 100%   BMI 41.57 kg/m  Physical Exam Vitals and nursing note reviewed.  Constitutional:      General: She is not in acute distress.    Appearance: She is not diaphoretic.  HENT:     Head: Normocephalic and atraumatic.     Mouth/Throat:     Pharynx: No oropharyngeal exudate.  Eyes:     General: No scleral icterus.    Conjunctiva/sclera: Conjunctivae normal.  Cardiovascular:     Rate and Rhythm: Normal rate and regular rhythm.     Pulses: Normal pulses.     Heart sounds: Normal heart sounds.  Pulmonary:     Effort: Pulmonary effort is normal. No respiratory distress.     Breath sounds: Normal breath sounds. No wheezing.  Abdominal:     General: Bowel sounds are normal.     Palpations: Abdomen is soft. There is no mass.     Tenderness: There is no abdominal tenderness. There is no guarding or rebound.  Musculoskeletal:        General: Normal range of motion.     Cervical back: Normal range of motion and neck supple.  Skin:    General: Skin is warm and dry.  Neurological:     General: No focal deficit present.     Mental Status: She is alert.     Cranial Nerves: Cranial  nerves 2-12 are intact.     Sensory: Sensation is intact.     Motor: Motor function is intact.     Coordination: Coordination is intact.     Comments: No focal neurological deficits. Negative pronator drift. Able to ambulate without assistance or difficulty. Strength and sensation intact to BUE and BLE. Grip strength 5/5 bilaterally.  Normal finger-nose testing.  Normal heel-to-shin testing.  Cranial nerves II through XII intact.  Psychiatric:        Behavior: Behavior normal.     ED Results / Procedures / Treatments   Labs (all labs ordered are listed, but only abnormal results are displayed) Labs Reviewed  RESP PANEL BY RT-PCR (FLU A&B, COVID) [id number]    EKG None  Radiology No results found.  Procedures Procedures    Medications Ordered in ED Medications  sodium  chloride 0.9 % bolus 1,000 mL (0 mLs Intravenous Stopped 05/02/22 1112)  ketorolac (TORADOL) 15 MG/ML injection 15 mg (15 mg Intravenous Given 05/02/22 1024)  ondansetron (ZOFRAN) injection 4 mg (4 mg Intravenous Given 05/02/22 1022)    ED Course/ Medical Decision Making/ A&P Clinical Course as of 05/02/22 1216  Wed May 02, 2022  1043 Pt re-evaluated and noted improvement of symptoms.  Discussed with patient that we may get a CT head at this time.  Through shared decision-making, patient opted to not proceed with a CT head at this time.  Discussed with patient discharge treatment plan.  Answered all available questions.  Patient appears safe for discharge at this time. [SB]    Clinical Course User Index [SB] Zilphia Kozinski A, PA-C                           Medical Decision Making Risk Prescription drug management.   Pt presented to the ED with gradual, intermittent frontal headache onset 6 days.  Has a history of headaches and noted that this headache is similar.  Has intermittent photophobia. Vital signs patient afebrile. On exam, pt with no focal neurodeficits on exam.  No vision changes.  No red flags of neck pain, neck stiffness, focal neuro deficits, or worst headache of life. Differential diagnosis includes SAH, ICH, migraine, tension headache, sinus headache, COVID, flu.  Labs:  I ordered, and personally interpreted labs.  The pertinent results include:   COVID and flu swab negative   Medications:  I ordered medication including IV fluids, Toradol, Zofran for migraine management Reevaluation of the patient after these medicines and interventions, I reevaluated the patient and found that they have improved I have reviewed the patients home medicines and have made adjustments as needed   Disposition: Presenting suspicious for a bad headache, likely sinus headache.  Doubt migraine, tension headache.  Doubt COVID, flu at this time.  Presentation less likely due to [facility] or ICH due to  the absence of red flags. In-depth conversation held with patient regarding blood pressure and blood pressure management.  After consideration of the diagnostic results and the patients response to treatment, I feel that the patient would benefit from Discharge home.  Patient provided with resources for primary care provider.  Supportive care measures and strict return precautions discussed with patient.  Patient knowledges and verbalized understanding.  Patient agreeable to discharge treatment plan. Patient appears safe for discharge at this time.  Follow-up as indicated in the discharge paperwork.   This chart was dictated using voice recognition software, Dragon. Despite the best efforts of this provider to  proofread and correct errors, errors may still occur which can change documentation meaning.  Final Clinical Impression(s) / ED Diagnoses Final diagnoses:  Bad headache    Rx / DC Orders ED Discharge Orders     None         Margret Moat A, PA-C 05/02/22 1217    Fredia Sorrow, MD 05/03/22 0825

## 2022-06-18 ENCOUNTER — Ambulatory Visit: Payer: 59

## 2022-06-18 NOTE — Progress Notes (Unsigned)
Pre visit completed via phone call; Patient verified name, DOB, and address;  No egg or soy allergy known to patient  No issues known to pt with past sedation with any surgeries or procedures Patient denies ever being told they had issues or difficulty with intubation  No FH of Malignant Hyperthermia Pt is not on diet pills Pt is not on home 02  Pt is not on blood thinners  Pt denies issues with constipation  No A fib or A flutter Have any cardiac testing pending--*** Pt instructed to use Singlecare.com or GoodRx for a price reduction on prep   Insurance verified during PV appt=***  Patient's chart reviewed by Osvaldo Angst CNRA prior to previsit and patient appropriate for the [facility].  Previsit completed and red dot placed by patient's name on their procedure day (on provider's schedule).

## 2022-06-21 ENCOUNTER — Ambulatory Visit (AMBULATORY_SURGERY_CENTER): Payer: 59 | Admitting: *Deleted

## 2022-06-21 VITALS — Ht 61.0 in | Wt 215.0 lb

## 2022-06-21 DIAGNOSIS — Z1211 Encounter for screening for malignant neoplasm of colon: Secondary | ICD-10-CM

## 2022-06-21 MED ORDER — NA SULFATE-K SULFATE-MG SULF 17.5-3.13-1.6 GM/177ML PO SOLN: 1.0000 | Freq: Once | ORAL | 0 refills | Status: AC

## 2022-06-21 NOTE — Progress Notes (Signed)

## 2022-06-25 ENCOUNTER — Encounter: Payer: Self-pay | Admitting: Gastroenterology

## 2022-07-02 ENCOUNTER — Encounter: Payer: Self-pay | Admitting: Gastroenterology

## 2022-07-02 ENCOUNTER — Ambulatory Visit (AMBULATORY_SURGERY_CENTER): Payer: 59 | Admitting: Gastroenterology

## 2022-07-02 VITALS — BP 147/81 | HR 75 | Temp 96.9°F | Resp 15 | Ht 61.0 in | Wt 215.0 lb

## 2022-07-02 DIAGNOSIS — Z1211 Encounter for screening for malignant neoplasm of colon: Secondary | ICD-10-CM | POA: Diagnosis present

## 2022-07-02 HISTORY — PX: COLONOSCOPY: SHX174

## 2022-07-02 MED ORDER — SODIUM CHLORIDE 0.9 % IV SOLN: 500.0000 mL | Freq: Once | INTRAVENOUS | Status: DC

## 2022-07-02 NOTE — Patient Instructions (Signed)
Handout on diverticulosis given.  Resume previous medications and continue current medications.     YOU HAD AN ENDOSCOPIC PROCEDURE TODAY AT [facility]:   Refer to the procedure report that was given to you for any specific questions about what was found during the examination.  If the procedure report does not answer your questions, please call your gastroenterologist to clarify.  If you requested that your care partner not be given the details of your procedure findings, then the procedure report has been included in a sealed envelope for you to review at your convenience later.  YOU SHOULD EXPECT: Some feelings of bloating in the abdomen. Passage of more gas than usual.  Walking can help get rid of the air that was put into your GI tract during the procedure and reduce the bloating. If you had a lower endoscopy (such as a colonoscopy or flexible sigmoidoscopy) you may notice spotting of blood in your stool or on the toilet paper. If you underwent a bowel prep for your procedure, you may not have a normal bowel movement for a few days.  Please Note:  You might notice some irritation and congestion in your nose or some drainage.  This is from the oxygen used during your procedure.  There is no need for concern and it should clear up in a day or so.  SYMPTOMS TO REPORT IMMEDIATELY:  Following lower endoscopy (colonoscopy or flexible sigmoidoscopy):  Excessive amounts of blood in the stool  Significant tenderness or worsening of abdominal pains  Swelling of the abdomen that is new, acute  Fever of 100F or higher  For urgent or emergent issues, a gastroenterologist can be reached at any hour by calling [phone]. Do not use MyChart messaging for urgent concerns.    DIET:  We do recommend a small meal at first, but then you may proceed to your regular diet.  Drink plenty of fluids but you should avoid alcoholic beverages for 24 hours.  ACTIVITY:  You should plan to take  it easy for the rest of today and you should NOT DRIVE or use heavy machinery until tomorrow (because of the sedation medicines used during the test).    FOLLOW UP: Our staff will call the number listed on your records the next business day following your procedure.  We will call around 7:15- 8:00 am to check on you and address any questions or concerns that you may have regarding the information given to you following your procedure. If we do not reach you, we will leave a message.     If any biopsies were taken you will be contacted by phone or by letter within the next 1-3 weeks.  Please call us at [phone] if you have not heard about the biopsies in 3 weeks.    SIGNATURES/CONFIDENTIALITY: You and/or your care partner have signed paperwork which will be entered into your electronic medical record.  These signatures attest to the fact that that the information above on your After Visit Summary has been reviewed and is understood.  Full responsibility of the confidentiality of this discharge information lies with you and/or your care-partner.

## 2022-07-02 NOTE — Progress Notes (Signed)
[facility] History and Physical   Primary Care Physician:  Servando Salina, MD   Reason for Procedure:   Colon cancer screening  Plan:    colonoscopy     HPI: Robin Cox is a 52 y.o. female  here for colonoscopy screening - first time exam.   Patient denies any bowel symptoms at this time. No family history of colon cancer known. Otherwise feels well without any cardiopulmonary symptoms.   I have discussed risks / benefits of anesthesia and endoscopic procedure with Robin Cox and they wish to proceed with the exams as outlined today.    Past Medical History:  Diagnosis Date   Allergy    Anemia    pt on iron pills   Anxiety    Depression    GERD (gastroesophageal reflux disease)    Headache(784.0)    h/a's prior to starting b/p medication - no problems since   Hypertension    no meds since wt loss   Morbid obesity (HCC)    Pre-diabetes    NO MEDS   Sleep apnea    no CPAP since wt loss    Past Surgical History:  Procedure Laterality Date   CHOLECYSTECTOMY N/A 08/02/2015   Procedure: LAPAROSCOPIC CHOLECYSTECTOMY WITH INTRAOPERATIVE CHOLANGIOGRAM;  Surgeon: Excell Seltzer, MD;  Location: [facility];  Service: General;  Laterality: N/A;   DILATION AND CURETTAGE OF UTERUS  1990   ESOPHAGOGASTRODUODENOSCOPY N/A 12/30/2012   Procedure: ESOPHAGOGASTRODUODENOSCOPY (EGD);  Surgeon: Edward Jolly, MD;  Location: [facility];  Service: General;  Laterality: N/A;   LAPAROSCOPIC GASTRIC SLEEVE RESECTION N/A 12/30/2012   Procedure: LAPAROSCOPIC GASTRIC SLEEVE RESECTION;  Surgeon: Edward Jolly, MD;  Location: [facility];  Service: General;  Laterality: N/A;  Laparoscopic Sleeve Gastrectomy     Prior to Admission medications   Medication Sig Start Date End Date Taking? Authorizing Provider  Cyanocobalamin (VITAMIN B-12 PO) Take by mouth daily.   Yes [provider]  ELDERBERRY PO Take by mouth daily. TAKE ONE GUMMY DAILY   Yes  [provider]  loratadine (CLARITIN) 10 MG tablet Take 10 mg by mouth daily.   Yes [provider]  MULTIPLE VITAMIN PO Take by mouth daily.   Yes [provider]  clonazePAM (KLONOPIN) 0.5 MG tablet Take 0.5-1 tablets (0.25-0.5 mg total) by mouth 2 (two) times daily as needed for anxiety. Patient not taking: Reported on 06/21/2022 08/27/18   Wendie Agreste, MD  sertraline (ZOLOFT) 50 MG tablet Take 1 tablet (50 mg total) by mouth daily. Patient not taking: Reported on 07/02/2022 08/27/18   Wendie Agreste, MD    Current Outpatient Medications  Medication Sig Dispense Refill   Cyanocobalamin (VITAMIN B-12 PO) Take by mouth daily.     ELDERBERRY PO Take by mouth daily. TAKE ONE GUMMY DAILY     loratadine (CLARITIN) 10 MG tablet Take 10 mg by mouth daily.     MULTIPLE VITAMIN PO Take by mouth daily.     clonazePAM (KLONOPIN) 0.5 MG tablet Take 0.5-1 tablets (0.25-0.5 mg total) by mouth 2 (two) times daily as needed for anxiety. (Patient not taking: Reported on 06/21/2022) 20 tablet 0   sertraline (ZOLOFT) 50 MG tablet Take 1 tablet (50 mg total) by mouth daily. (Patient not taking: Reported on 07/02/2022) 90 tablet 1   Current Facility-Administered Medications  Medication Dose Route Frequency Provider Last Rate Last Admin   0.9 %  sodium chloride infusion  500 mL Intravenous Once  Yetta Flock, MD        Allergies as of 07/02/2022 - Review Complete 07/02/2022  Allergen Reaction Noted   Augmentin [amoxicillin-pot clavulanate] Nausea And Vomiting 09/23/2012   Azithromycin Nausea And Vomiting 05/22/2012    Family History  Problem Relation Age of Onset   Allergies Sister    Hypertension Sister    Allergies Sister    Hypertension Brother    Allergies Brother    Stroke Brother    Breast cancer Maternal Aunt    Stomach cancer Maternal Grandmother    Pancreatic cancer Maternal Grandmother        pancreatic   Colon cancer Neg Hx    Colon polyps Neg Hx     Crohn's disease Neg Hx    Esophageal cancer Neg Hx    Ulcerative colitis Neg Hx     Social History   Socioeconomic History   Marital status: Single    Spouse name: Not on file   Number of children: 0   Years of education: Not on file   Highest education level: Not on file  Occupational History   Not on file  Tobacco Use   Smoking status: Never   Smokeless tobacco: Never  Vaping Use   Vaping Use: Never used  Substance and Sexual Activity   Alcohol use: Yes    Comment: Rare   Drug use: No   Sexual activity: Not Currently  Other Topics Concern   Not on file  Social History Narrative   Not on file   Social Determinants of Health   Financial Resource Strain: Not on file  Food Insecurity: Not on file  Transportation Needs: Not on file  Physical Activity: Not on file  Stress: Not on file  Social Connections: Not on file  Intimate Partner Violence: Not on file    Review of Systems: All other review of systems negative except as mentioned in the HPI.  Physical Exam: Vital signs BP (!) 148/85   Pulse 87   Temp (!) 96.9 F (36.1 C)   Ht '5\' 1"'$  (1.549 m)   Wt 215 lb (97.5 kg)   LMP 06/02/2022   SpO2 97%   BMI 40.62 kg/m   General:   Alert,  Well-developed, pleasant and cooperative in NAD Lungs:  Clear throughout to auscultation.   Heart:  Regular rate and rhythm Abdomen:  Soft, nontender and nondistended.   Neuro/Psych:  Alert and cooperative. Normal mood and affect. A and O x 3  Jolly Mango, MD [facility]

## 2022-07-02 NOTE — Progress Notes (Signed)
Pt awake, alert and oriented. VSS. Airway intact. SBAR complete to RN. All questions answered.  

## 2022-07-02 NOTE — Progress Notes (Signed)
VS by DT  Pt's states no medical or surgical changes since previsit or office visit.  

## 2022-07-02 NOTE — Op Note (Signed)
[facility] Patient Name: Robin Cox Procedure Date: 07/02/2022 10:26 AM MRN: [id number] Endoscopist: Remo Lipps P. Havery Moros , MD, [id number] Age: 52 Referring MD:  Date of Birth: [birth year] Gender: Female Account #: [id number] Procedure:                Colonoscopy Indications:              Screening for colorectal malignant neoplasm, This                            is the patient's first colonoscopy Medicines:                Monitored Anesthesia Care Procedure:                Pre-Anesthesia Assessment:                           - Prior to the procedure, a History and Physical                            was performed, and patient medications and                            allergies were reviewed. The patient's tolerance of                            previous anesthesia was also reviewed. The risks                            and benefits of the procedure and the sedation                            options and risks were discussed with the patient.                            All questions were answered, and informed consent                            was obtained. Prior Anticoagulants: The patient has                            taken no anticoagulant or antiplatelet agents. ASA                            Grade Assessment: II - A patient with mild systemic                            disease. After reviewing the risks and benefits,                            the patient was deemed in satisfactory condition to                            undergo the procedure.  After obtaining informed consent, the colonoscope                            was passed under direct vision. Throughout the                            procedure, the patient's blood pressure, pulse, and                            oxygen saturations were monitored continuously. The                            Olympus PCF-[id number] (#[id number]) Colonoscope was                            introduced through  the anus and advanced to the the                            cecum, identified by appendiceal orifice and                            ileocecal valve. The colonoscopy was performed                            without difficulty. The patient tolerated the                            procedure well. The quality of the bowel                            preparation was good. The ileocecal valve,                            appendiceal orifice, and rectum were photographed. Scope In: 10:47:39 AM Scope Out: 10:59:49 AM Scope Withdrawal Time: 0 hours 9 minutes 51 seconds  Total Procedure Duration: 0 hours 12 minutes 10 seconds  Findings:                 The perianal and digital rectal examinations were                            normal.                           A few small-mouthed diverticula were found in the                            transverse colon and left colon.                           Anal papilla(e) were hypertrophied.                           Internal hemorrhoids were found during  retroflexion. The hemorrhoids were small.                           The exam was otherwise without abnormality. Complications:            No immediate complications. Estimated blood loss:                            Minimal. Estimated Blood Loss:     Estimated blood loss was minimal. Impression:               - Diverticulosis in the transverse colon and in the                            left colon.                           - Anal papilla(e) were hypertrophied.                           - Internal hemorrhoids.                           - The examination was otherwise normal.                           - No polyps Recommendation:           - Patient has a contact number available for                            emergencies. The signs and symptoms of potential                            delayed complications were discussed with the                            patient. Return to normal  activities tomorrow.                            Written discharge instructions were provided to the                            patient.                           - Resume previous diet.                           - Continue present medications.                           - Repeat colonoscopy in 10 years for screening                            purposes. Remo Lipps P. Nuria Phebus, MD 07/02/2022 11:03:56 AM This report has been signed electronically.

## 2022-07-03 ENCOUNTER — Telehealth: Payer: Self-pay

## 2022-07-03 NOTE — Telephone Encounter (Signed)
Left message on answering machine. 

## 2022-12-30 ENCOUNTER — Other Ambulatory Visit: Payer: Self-pay | Admitting: Obstetrics and Gynecology

## 2023-01-23 ENCOUNTER — Other Ambulatory Visit: Payer: Self-pay

## 2023-01-23 ENCOUNTER — Encounter (HOSPITAL_BASED_OUTPATIENT_CLINIC_OR_DEPARTMENT_OTHER): Payer: Self-pay | Admitting: Obstetrics and Gynecology

## 2023-01-23 DIAGNOSIS — Z01812 Encounter for preprocedural laboratory examination: Secondary | ICD-10-CM | POA: Diagnosis present

## 2023-01-23 DIAGNOSIS — R7989 Other specified abnormal findings of blood chemistry: Secondary | ICD-10-CM | POA: Diagnosis not present

## 2023-01-23 NOTE — Progress Notes (Addendum)
Your procedure is scheduled on Friday, 02/08/2023.  Report to [facility] AT  5:30 AM.   Call this number if you have problems the morning of surgery  [phone].   OUR ADDRESS IS [address].  WE ARE LOCATED IN [facility].  PLEASE BRING YOUR INSURANCE CARD AND PHOTO ID DAY OF SURGERY.  ONLY 2 PEOPLE ARE ALLOWED IN  WAITING  ROOM                                      REMEMBER:  DO NOT EAT FOOD, CANDY GUM OR MINTS  AFTER MIDNIGHT THE NIGHT BEFORE YOUR SURGERY . YOU MAY HAVE CLEAR LIQUIDS FROM MIDNIGHT THE NIGHT BEFORE YOUR SURGERY UNTIL  4:30 AM. NO CLEAR LIQUIDS AFTER   4:30 AM DAY OF SURGERY.  YOU MAY  BRUSH YOUR TEETH MORNING OF SURGERY AND RINSE YOUR MOUTH OUT, NO CHEWING GUM CANDY OR MINTS.     CLEAR LIQUID DIET    Allowed      Water                                                                   Coffee and tea, regular and decaf  (NO cream or milk products of any type, may sweeten)                         Carbonated beverages, regular and diet                                    Sports drinks like Gatorade _____________________________________________________________________     TAKE ONLY THESE MEDICATIONS MORNING OF SURGERY: Claritin, Hydroxyzine if needed                                        DO NOT WEAR JEWERLY/  METAL/  PIERCINGS (INCLUDING NO PLASTIC PIERCINGS) DO NOT WEAR LOTIONS, POWDERS, PERFUMES OR NAIL POLISH ON YOUR FINGERNAILS. TOENAIL POLISH IS OK TO WEAR. DO NOT SHAVE FOR 48 HOURS PRIOR TO DAY OF SURGERY.  CONTACTS, GLASSES, OR DENTURES MAY NOT BE WORN TO SURGERY.  REMEMBER: NO SMOKING, VAPING ,  DRUGS OR ALCOHOL FOR 24 HOURS BEFORE YOUR SURGERY.                                    [place] IS NOT RESPONSIBLE  FOR ANY BELONGINGS.                                                                    Marland Kitchen           [place] - Preparing for  Surgery Before surgery, you can play an important role.  Because  skin is not sterile, your skin needs to be as free of germs as possible.  You can reduce the number of germs on your skin by washing with CHG (chlorahexidine gluconate) soap before surgery.  CHG is an antiseptic cleaner which kills germs and bonds with the skin to continue killing germs even after washing. Please DO NOT use if you have an allergy to CHG or antibacterial soaps.  If your skin becomes reddened/irritated stop using the CHG and inform your nurse when you arrive at Short Stay. Do not shave (including legs and underarms) for at least 48 hours prior to the first CHG shower.  You may shave your face/neck. Please follow these instructions carefully:  1.  Shower with CHG Soap the night before surgery and the  morning of Surgery.  2.  If you choose to wash your hair, wash your hair first as usual with your  normal  shampoo.  3.  After you shampoo, rinse your hair and body thoroughly to remove the  shampoo.                                        4.  Use CHG as you would any other liquid soap.  You can apply chg directly  to the skin and wash , chg soap provided, night before and morning of your surgery.  5.  Apply the CHG Soap to your body ONLY FROM THE NECK DOWN.   Do not use on face/ open                           Wound or open sores. Avoid contact with eyes, ears mouth and genitals (private parts).                       Wash face,  Genitals (private parts) with your normal soap.             6.  Wash thoroughly, paying special attention to the area where your surgery  will be performed.  7.  Thoroughly rinse your body with warm water from the neck down.  8.  DO NOT shower/wash with your normal soap after using and rinsing off  the CHG Soap.             9.  Pat yourself dry with a clean towel.            10.  Wear clean pajamas.            11.  Place clean sheets on your bed the night of your first shower and do not  sleep with pets. Day of Surgery : Do not apply any lotions/ powders the  morning of surgery.  Please wear clean clothes to the hospital/surgery center.  IF YOU HAVE ANY SKIN IRRITATION OR PROBLEMS WITH THE SURGICAL SOAP, PLEASE GET A BAR OF GOLD DIAL SOAP AND SHOWER THE NIGHT BEFORE YOUR SURGERY AND THE MORNING OF YOUR SURGERY. PLEASE LET THE NURSE KNOW MORNING OF YOUR SURGERY IF YOU HAD ANY PROBLEMS WITH THE SURGICAL SOAP.   YOUR SURGEON MAY HAVE REQUESTED EXTENDED RECOVERY TIME AFTER YOUR SURGERY. IT COULD BE A  JUST A FEW HOURS  UP TO AN OVERNIGHT STAY.  YOUR SURGEON SHOULD HAVE DISCUSSED THIS WITH YOU PRIOR  TO YOUR SURGERY. IN THE EVENT YOU NEED TO STAY OVERNIGHT PLEASE REFER TO THE FOLLOWING GUIDELINES. YOU MAY HAVE UP TO 4 VISITORS  MAY VISIT IN THE EXTENDED RECOVERY ROOM UNTIL 800 PM ONLY.  ONE  VISITOR AGE 41 AND OVER MAY SPEND THE NIGHT AND MUST BE IN EXTENDED RECOVERY ROOM NO LATER THAN 800 PM . YOUR DISCHARGE TIME AFTER YOU SPEND THE NIGHT IS 900 AM THE MORNING AFTER YOUR SURGERY. YOU MAY PACK A SMALL OVERNIGHT BAG WITH TOILETRIES FOR YOUR OVERNIGHT STAY IF YOU WISH.  REGARDLESS OF IF YOU STAY OVER NIGHT OR ARE DISCHARGED THE SAME DAY YOU WILL BE REQUIRED TO HAVE A RESPONSIBLE ADULT (18 YRS OLD OR OLDER) STAY WITH YOU FOR AT LEAST THE FIRST 24 HOURS  YOUR PRESCRIPTION MEDICATIONS WILL BE PROVIDED DURING YOUR HOSPITAL STAY.  ________________________________________________________________________                                                        QUESTIONS Mechele Claude PRE OP NURSE PHONE [phone].

## 2023-01-23 NOTE — Progress Notes (Signed)
Spoke w/ via phone for pre-op interview---[facility] needs dos----  urine pregnancy             Lab results------02/05/23 lab appt for cbc, type & screen, bmp COVID test -----patient states asymptomatic no test needed Arrive at -------0530 on Friday, 02/08/23 NPO after MN NO Solid Food.  Clear liquids from MN until---0430 Med rec completed Medications to take morning of surgery -----Hydroxyzine prn, Claritin Diabetic medication -----Hold Ozempic x 7 days. Patient stated her surgeon told her to stop Ozempic 3 weeks before surgery. Her last dose was 01/16/23. Patient instructed no nail polish to be worn day of surgery Patient instructed to bring photo id and insurance card day of surgery Patient aware to have Driver (ride ) / caregiver    for 24 hours after surgery -sister Patient Special Instructions -----Extended / overnight stay instructions given. Pre-Op special Instructions -----none Patient verbalized understanding of instructions that were given at this phone interview. Patient denies shortness of breath, chest pain, fever, cough at this phone interview.

## 2023-02-05 ENCOUNTER — Encounter (HOSPITAL_COMMUNITY)
Admission: RE | Admit: 2023-02-05 | Discharge: 2023-02-05 | Disposition: A | Discharge status: Home / Self Care | Payer: 59 | Source: Ambulatory Visit | Attending: Obstetrics and Gynecology | Admitting: Obstetrics and Gynecology

## 2023-02-05 DIAGNOSIS — Z01812 Encounter for preprocedural laboratory examination: Secondary | ICD-10-CM | POA: Insufficient documentation

## 2023-02-05 DIAGNOSIS — Z01818 Encounter for other preprocedural examination: Secondary | ICD-10-CM

## 2023-02-05 DIAGNOSIS — R7989 Other specified abnormal findings of blood chemistry: Secondary | ICD-10-CM | POA: Insufficient documentation

## 2023-02-05 LAB — BASIC METABOLIC PANEL
Anion gap: 6 (ref 5–15)
BUN: 7 mg/dL (ref 6–20)
CO2: 25 mmol/L (ref 22–32)
Calcium: 8.6 mg/dL — ABNORMAL LOW (ref 8.9–10.3)
Chloride: 104 mmol/L (ref 98–111)
Creatinine, Ser: 0.87 mg/dL (ref 0.44–1.00)
GFR, Estimated: >60 mL/min (ref >60)
Glucose, Bld: 89 mg/dL (ref 70–99)
Potassium: 4 mmol/L (ref 3.5–5.1)
Sodium: 135 mmol/L (ref 135–145)

## 2023-02-05 LAB — CBC
HCT: 32.5 % — ABNORMAL LOW (ref 36.0–46.0)
Hemoglobin: 9.3 g/dL — ABNORMAL LOW (ref 12.0–15.0)
MCH: 21.9 pg — ABNORMAL LOW (ref 26.0–34.0)
MCHC: 28.6 g/dL — ABNORMAL LOW (ref 30.0–36.0)
MCV: 76.5 fL — ABNORMAL LOW (ref 80.0–100.0)
Platelets: 295 10*3/uL (ref 150–400)
RBC: 4.25 MIL/uL (ref 3.87–5.11)
RDW: 19.2 % — ABNORMAL HIGH (ref 11.5–15.5)
WBC: 6.8 10*3/uL (ref 4.0–10.5)
nRBC: 0 % (ref 0.0–0.2)

## 2023-02-05 NOTE — Progress Notes (Signed)
Routed abnormal lab work to Dr. Cherly Hensen on 02/05/23. I also sent her an epic IB message making her aware.

## 2023-02-07 NOTE — Anesthesia Preprocedure Evaluation (Addendum)
Anesthesia Evaluation  Patient identified by MRN, date of birth, ID band Patient awake    Reviewed: Allergy & Precautions, NPO status , Patient's Chart, lab work & pertinent test results  History of Anesthesia Complications Negative for: history of anesthetic complications  Airway Mallampati: II  TM Distance: >3 FB Neck ROM: Full    Dental no notable dental hx.    Pulmonary sleep apnea    Pulmonary exam normal        Cardiovascular hypertension, Normal cardiovascular exam     Neuro/Psych  Headaches  Anxiety Depression       GI/Hepatic Neg liver ROS,GERD  Controlled,,  Endo/Other    Morbid obesityOn ZOXWRU  Renal/GU negative Renal ROS     Musculoskeletal negative musculoskeletal ROS (+)    Abdominal   Peds  Hematology  (+) Blood dyscrasia (Hgb 9.3), anemia   Anesthesia Other Findings Day of surgery medications reviewed with patient.  Reproductive/Obstetrics                              Anesthesia Physical Anesthesia Plan  ASA: 3  Anesthesia Plan: General   Post-op Pain Management: Tylenol PO (pre-op)* and Toradol IV (intra-op)*   Induction: Intravenous  PONV Risk Score and Plan: 4 or greater and Treatment may vary due to age or medical condition, Ondansetron, Dexamethasone, Midazolam and Scopolamine patch - Pre-op  Airway Management Planned: Oral ETT  Additional Equipment: None  Intra-op Plan:   Post-operative Plan: Extubation in OR  Informed Consent: I have reviewed the patients History and Physical, chart, labs and discussed the procedure including the risks, benefits and alternatives for the proposed anesthesia with the patient or authorized representative who has indicated his/her understanding and acceptance.     Dental advisory given  Plan Discussed with: CRNA  Anesthesia Plan Comments:         Anesthesia Quick Evaluation

## 2023-02-08 ENCOUNTER — Ambulatory Visit (HOSPITAL_BASED_OUTPATIENT_CLINIC_OR_DEPARTMENT_OTHER): Payer: 59 | Admitting: Anesthesiology

## 2023-02-08 ENCOUNTER — Ambulatory Visit (HOSPITAL_BASED_OUTPATIENT_CLINIC_OR_DEPARTMENT_OTHER)
Admission: RE | Admit: 2023-02-08 | Discharge: 2023-02-09 | Disposition: A | Discharge status: Home / Self Care | Payer: 59 | Attending: Obstetrics and Gynecology | Admitting: Obstetrics and Gynecology

## 2023-02-08 ENCOUNTER — Encounter (HOSPITAL_BASED_OUTPATIENT_CLINIC_OR_DEPARTMENT_OTHER)
Admission: RE | Disposition: A | Discharge status: Home / Self Care | Payer: Self-pay | Source: Home / Self Care | Attending: Obstetrics and Gynecology

## 2023-02-08 ENCOUNTER — Encounter (HOSPITAL_BASED_OUTPATIENT_CLINIC_OR_DEPARTMENT_OTHER): Payer: Self-pay | Admitting: Obstetrics and Gynecology

## 2023-02-08 ENCOUNTER — Other Ambulatory Visit: Payer: Self-pay

## 2023-02-08 DIAGNOSIS — G4733 Obstructive sleep apnea (adult) (pediatric): Secondary | ICD-10-CM | POA: Diagnosis not present

## 2023-02-08 DIAGNOSIS — Z01818 Encounter for other preprocedural examination: Secondary | ICD-10-CM

## 2023-02-08 DIAGNOSIS — F418 Other specified anxiety disorders: Secondary | ICD-10-CM | POA: Diagnosis not present

## 2023-02-08 DIAGNOSIS — Z6841 Body Mass Index (BMI) 40.0 and over, adult: Secondary | ICD-10-CM | POA: Diagnosis not present

## 2023-02-08 DIAGNOSIS — D259 Leiomyoma of uterus, unspecified: Secondary | ICD-10-CM | POA: Diagnosis not present

## 2023-02-08 DIAGNOSIS — Z90710 Acquired absence of both cervix and uterus: Secondary | ICD-10-CM | POA: Diagnosis present

## 2023-02-08 DIAGNOSIS — D251 Intramural leiomyoma of uterus: Secondary | ICD-10-CM | POA: Insufficient documentation

## 2023-02-08 HISTORY — PX: ROBOTIC ASSISTED LAPAROSCOPIC HYSTERECTOMY AND SALPINGECTOMY: SHX6379

## 2023-02-08 HISTORY — PX: CYSTOSCOPY: SHX5120

## 2023-02-08 LAB — TYPE AND SCREEN
ABO/RH(D): B POS
Antibody Screen: NEGATIVE

## 2023-02-08 LAB — POCT PREGNANCY, URINE: Preg Test, Ur: NEGATIVE

## 2023-02-08 LAB — ABO/RH: ABO/RH(D): B POS

## 2023-02-08 SURGERY — XI ROBOTIC ASSISTED LAPAROSCOPIC HYSTERECTOMY AND SALPINGECTOMY
Anesthesia: General | Site: Bladder

## 2023-02-08 MED ORDER — FENTANYL CITRATE (PF) 100 MCG/2ML IJ SOLN
25.0000 ug | INTRAMUSCULAR | Status: DC | PRN
  Administered 2023-02-08: 50 ug via INTRAVENOUS

## 2023-02-08 MED ORDER — IBUPROFEN 200 MG PO TABS
ORAL_TABLET | ORAL | Status: AC
  Filled 2023-02-08: qty 3

## 2023-02-08 MED ORDER — SCOPOLAMINE 1 MG/3DAYS TD PT72
1.0000 | MEDICATED_PATCH | Freq: Once | TRANSDERMAL | Status: DC
  Administered 2023-02-08: 1.5 mg via TRANSDERMAL

## 2023-02-08 MED ORDER — OXYCODONE HCL 5 MG PO TABS
ORAL_TABLET | ORAL | Status: AC
  Filled 2023-02-08: qty 1

## 2023-02-08 MED ORDER — SUGAMMADEX SODIUM 200 MG/2ML IV SOLN
INTRAVENOUS | Status: DC | PRN
  Administered 2023-02-08: 200 mg via INTRAVENOUS

## 2023-02-08 MED ORDER — ACETAMINOPHEN 500 MG PO TABS
ORAL_TABLET | ORAL | Status: AC
  Filled 2023-02-08: qty 2

## 2023-02-08 MED ORDER — ONDANSETRON HCL 4 MG/2ML IJ SOLN: 4.0000 mg | Freq: Four times a day (QID) | INTRAMUSCULAR | Status: DC | PRN

## 2023-02-08 MED ORDER — SIMETHICONE 80 MG PO CHEW
CHEWABLE_TABLET | ORAL | Status: AC
  Filled 2023-02-08: qty 1

## 2023-02-08 MED ORDER — MEPERIDINE HCL 25 MG/ML IJ SOLN
INTRAMUSCULAR | Status: AC
  Filled 2023-02-08: qty 1

## 2023-02-08 MED ORDER — PHENYLEPHRINE 80 MCG/ML (10ML) SYRINGE FOR IV PUSH (FOR BLOOD PRESSURE SUPPORT)
PREFILLED_SYRINGE | INTRAVENOUS | Status: DC | PRN
  Administered 2023-02-08 (×2): 80 ug via INTRAVENOUS
  Administered 2023-02-08: 160 ug via INTRAVENOUS
  Administered 2023-02-08: 80 ug via INTRAVENOUS

## 2023-02-08 MED ORDER — HYDROMORPHONE HCL 1 MG/ML IJ SOLN
INTRAMUSCULAR | Status: DC | PRN
Start: 2023-02-08 — End: 2023-02-08
  Administered 2023-02-08 (×2): 1 mg via INTRAVENOUS

## 2023-02-08 MED ORDER — PROPOFOL 10 MG/ML IV BOLUS
INTRAVENOUS | Status: AC
  Filled 2023-02-08: qty 20

## 2023-02-08 MED ORDER — PROPOFOL 10 MG/ML IV BOLUS
INTRAVENOUS | Status: DC | PRN
  Administered 2023-02-08: 50 mg via INTRAVENOUS
  Administered 2023-02-08: 150 mg via INTRAVENOUS

## 2023-02-08 MED ORDER — FENTANYL CITRATE (PF) 100 MCG/2ML IJ SOLN
INTRAMUSCULAR | Status: AC
  Filled 2023-02-08: qty 2

## 2023-02-08 MED ORDER — ONDANSETRON HCL 4 MG/2ML IJ SOLN
INTRAMUSCULAR | Status: DC | PRN
  Administered 2023-02-08: 4 mg via INTRAVENOUS

## 2023-02-08 MED ORDER — MIDAZOLAM HCL 2 MG/2ML IJ SOLN
INTRAMUSCULAR | Status: DC | PRN
  Administered 2023-02-08: 2 mg via INTRAVENOUS

## 2023-02-08 MED ORDER — POVIDONE-IODINE 10 % EX SWAB: 2.0000 | Freq: Once | CUTANEOUS | Status: DC

## 2023-02-08 MED ORDER — CEFAZOLIN SODIUM-DEXTROSE 2-3 GM-%(50ML) IV SOLR
INTRAVENOUS | Status: DC | PRN
Start: 2023-02-08 — End: 2023-02-08
  Administered 2023-02-08 (×2): 2 g via INTRAVENOUS

## 2023-02-08 MED ORDER — DEXAMETHASONE SODIUM PHOSPHATE 10 MG/ML IJ SOLN
INTRAMUSCULAR | Status: DC | PRN
  Administered 2023-02-08: 10 mg via INTRAVENOUS

## 2023-02-08 MED ORDER — ONDANSETRON HCL 4 MG PO TABS: 4.0000 mg | ORAL_TABLET | Freq: Four times a day (QID) | ORAL | Status: DC | PRN

## 2023-02-08 MED ORDER — DROPERIDOL 2.5 MG/ML IJ SOLN: 0.6250 mg | Freq: Once | INTRAMUSCULAR | Status: DC | PRN

## 2023-02-08 MED ORDER — SODIUM CHLORIDE 0.9 % IR SOLN
Status: DC | PRN
Start: 2023-02-08 — End: 2023-02-08
  Administered 2023-02-08: 1000 mL
  Administered 2023-02-08: 1000 mL via INTRAVESICAL

## 2023-02-08 MED ORDER — CEFAZOLIN SODIUM-DEXTROSE 2-4 GM/100ML-% IV SOLN
INTRAVENOUS | Status: AC
  Filled 2023-02-08: qty 100

## 2023-02-08 MED ORDER — ROCURONIUM BROMIDE 10 MG/ML (PF) SYRINGE
PREFILLED_SYRINGE | INTRAVENOUS | Status: AC
  Filled 2023-02-08: qty 10

## 2023-02-08 MED ORDER — MEPERIDINE HCL 25 MG/ML IJ SOLN
6.2500 mg | Freq: Once | INTRAMUSCULAR | Status: AC
  Administered 2023-02-08: 6.25 mg via INTRAVENOUS

## 2023-02-08 MED ORDER — OXYCODONE HCL 5 MG/5ML PO SOLN: 5.0000 mg | Freq: Once | ORAL | Status: DC | PRN

## 2023-02-08 MED ORDER — PANTOPRAZOLE SODIUM 40 MG PO TBEC
DELAYED_RELEASE_TABLET | ORAL | Status: AC
  Filled 2023-02-08: qty 1

## 2023-02-08 MED ORDER — DEXTROSE IN LACTATED RINGERS 5 % IV SOLN: INTRAVENOUS | Status: DC

## 2023-02-08 MED ORDER — OXYCODONE HCL 5 MG PO TABS: 5.0000 mg | ORAL_TABLET | Freq: Once | ORAL | Status: DC | PRN

## 2023-02-08 MED ORDER — ROCURONIUM BROMIDE 10 MG/ML (PF) SYRINGE
PREFILLED_SYRINGE | INTRAVENOUS | Status: DC | PRN
  Administered 2023-02-08: 60 mg via INTRAVENOUS
  Administered 2023-02-08 (×2): 10 mg via INTRAVENOUS
  Administered 2023-02-08: 20 mg via INTRAVENOUS
  Administered 2023-02-08: 10 mg via INTRAVENOUS
  Administered 2023-02-08: 20 mg via INTRAVENOUS

## 2023-02-08 MED ORDER — LIDOCAINE 2% (20 MG/ML) 5 ML SYRINGE
INTRAMUSCULAR | Status: DC | PRN
  Administered 2023-02-08: 60 mg via INTRAVENOUS

## 2023-02-08 MED ORDER — FENTANYL CITRATE (PF) 100 MCG/2ML IJ SOLN
INTRAMUSCULAR | Status: DC | PRN
  Administered 2023-02-08 (×4): 50 ug via INTRAVENOUS

## 2023-02-08 MED ORDER — KETOROLAC TROMETHAMINE 30 MG/ML IJ SOLN
INTRAMUSCULAR | Status: DC | PRN
Start: 2023-02-08 — End: 2023-02-08
  Administered 2023-02-08: 30 mg via INTRAVENOUS

## 2023-02-08 MED ORDER — HEMOSTATIC AGENTS (NO CHARGE) OPTIME
TOPICAL | Status: DC | PRN
  Administered 2023-02-08: 1 via TOPICAL

## 2023-02-08 MED ORDER — DEXAMETHASONE SODIUM PHOSPHATE 10 MG/ML IJ SOLN
INTRAMUSCULAR | Status: AC
  Filled 2023-02-08: qty 1

## 2023-02-08 MED ORDER — LIDOCAINE HCL (PF) 2 % IJ SOLN
INTRAMUSCULAR | Status: AC
  Filled 2023-02-08: qty 5

## 2023-02-08 MED ORDER — ACETAMINOPHEN 500 MG PO TABS
1000.0000 mg | ORAL_TABLET | Freq: Once | ORAL | Status: AC
  Administered 2023-02-08: 1000 mg via ORAL

## 2023-02-08 MED ORDER — MENTHOL 3 MG MT LOZG: 1.0000 | LOZENGE | OROMUCOSAL | Status: DC | PRN

## 2023-02-08 MED ORDER — SCOPOLAMINE 1 MG/3DAYS TD PT72
MEDICATED_PATCH | TRANSDERMAL | Status: AC
  Filled 2023-02-08: qty 1

## 2023-02-08 MED ORDER — SIMETHICONE 80 MG PO CHEW
80.0000 mg | CHEWABLE_TABLET | Freq: Four times a day (QID) | ORAL | Status: DC | PRN
  Administered 2023-02-08: 80 mg via ORAL

## 2023-02-08 MED ORDER — ACETAMINOPHEN 500 MG PO TABS
1000.0000 mg | ORAL_TABLET | Freq: Four times a day (QID) | ORAL | Status: DC
  Administered 2023-02-08 – 2023-02-09 (×3): 1000 mg via ORAL

## 2023-02-08 MED ORDER — CEFAZOLIN SODIUM 1 G IJ SOLR
INTRAMUSCULAR | Status: AC
  Filled 2023-02-08: qty 20

## 2023-02-08 MED ORDER — IBUPROFEN 200 MG PO TABS
600.0000 mg | ORAL_TABLET | Freq: Four times a day (QID) | ORAL | Status: DC
  Administered 2023-02-08 – 2023-02-09 (×3): 600 mg via ORAL

## 2023-02-08 MED ORDER — MIDAZOLAM HCL 2 MG/2ML IJ SOLN
INTRAMUSCULAR | Status: AC
  Filled 2023-02-08: qty 2

## 2023-02-08 MED ORDER — OXYCODONE HCL 5 MG PO TABS
5.0000 mg | ORAL_TABLET | ORAL | Status: DC | PRN
  Administered 2023-02-08 (×2): 5 mg via ORAL
  Administered 2023-02-09: 10 mg via ORAL
  Administered 2023-02-09 (×2): 5 mg via ORAL

## 2023-02-08 MED ORDER — STERILE WATER FOR IRRIGATION IR SOLN
Status: DC | PRN
Start: 2023-02-08 — End: 2023-02-08
  Administered 2023-02-08: 1000 mL

## 2023-02-08 MED ORDER — ONDANSETRON HCL 4 MG/2ML IJ SOLN
INTRAMUSCULAR | Status: AC
  Filled 2023-02-08: qty 2

## 2023-02-08 MED ORDER — LACTATED RINGERS IV SOLN: INTRAVENOUS | Status: DC

## 2023-02-08 MED ORDER — KETOROLAC TROMETHAMINE 30 MG/ML IJ SOLN
INTRAMUSCULAR | Status: AC
  Filled 2023-02-08: qty 1

## 2023-02-08 MED ORDER — PHENYLEPHRINE 80 MCG/ML (10ML) SYRINGE FOR IV PUSH (FOR BLOOD PRESSURE SUPPORT)
PREFILLED_SYRINGE | INTRAVENOUS | Status: AC
  Filled 2023-02-08: qty 20

## 2023-02-08 MED ORDER — BUPIVACAINE HCL (PF) 0.25 % IJ SOLN
INTRAMUSCULAR | Status: DC | PRN
  Administered 2023-02-08: 13 mL

## 2023-02-08 MED ORDER — ALBUMIN HUMAN 5 % IV SOLN
INTRAVENOUS | Status: DC | PRN
Start: 2023-02-08 — End: 2023-02-08

## 2023-02-08 MED ORDER — HYDROMORPHONE HCL 2 MG/ML IJ SOLN
INTRAMUSCULAR | Status: AC
  Filled 2023-02-08: qty 1

## 2023-02-08 MED ORDER — PANTOPRAZOLE SODIUM 40 MG PO TBEC
40.0000 mg | DELAYED_RELEASE_TABLET | Freq: Every day | ORAL | Status: DC
  Administered 2023-02-08: 40 mg via ORAL

## 2023-02-08 SURGICAL SUPPLY — 70 items
ADH SKN CLS APL DERMABOND .7 (GAUZE/BANDAGES/DRESSINGS) ×2
APL SRG 38 LTWT LNG FL B (MISCELLANEOUS) ×2
APPLICATOR ARISTA FLEXITIP XL (MISCELLANEOUS) ×2 IMPLANT
BARRIER ADHS 3X4 INTERCEED (GAUZE/BANDAGES/DRESSINGS) IMPLANT
BRR ADH 4X3 ABS CNTRL BYND (GAUZE/BANDAGES/DRESSINGS)
CATH FOLEY 3WAY 5CC 16FR (CATHETERS) ×2 IMPLANT
CAUTERY HOOK MNPLR 1.6 DVNC XI (INSTRUMENTS) IMPLANT
COVER BACK TABLE 60X90IN (DRAPES) ×2 IMPLANT
COVER TIP SHEARS 8 DVNC (MISCELLANEOUS) ×2 IMPLANT
DEFOGGER SCOPE WARMER CLEARIFY (MISCELLANEOUS) ×2 IMPLANT
DERMABOND ADVANCED .7 DNX12 (GAUZE/BANDAGES/DRESSINGS) ×2 IMPLANT
DRAPE ARM DVNC X/XI (DISPOSABLE) ×8 IMPLANT
DRAPE COLUMN DVNC XI (DISPOSABLE) ×2 IMPLANT
DRAPE SURG IRRIG POUCH 19X23 (DRAPES) ×2 IMPLANT
DRAPE UTILITY XL STRL (DRAPES) ×2 IMPLANT
DRIVER NDL MEGA SUTCUT DVNCXI (INSTRUMENTS) ×2 IMPLANT
DRIVER NDLE MEGA SUTCUT DVNCXI (INSTRUMENTS) ×2
DURAPREP 26ML APPLICATOR (WOUND CARE) ×2 IMPLANT
ELECT REM PT RETURN 9FT ADLT (ELECTROSURGICAL) ×2
ELECTRODE REM PT RTRN 9FT ADLT (ELECTROSURGICAL) ×2 IMPLANT
FORCEPS BPLR LNG DVNC XI (INSTRUMENTS) ×2 IMPLANT
FORCEPS LONG TIP 8 DVNC XI (FORCEP) ×2 IMPLANT
FORCEPS TENACULUM DVNC XI (FORCEP) IMPLANT
GAUZE 4X4 16PLY ~~LOC~~+RFID DBL (SPONGE) IMPLANT
GLOVE BIOGEL PI IND STRL 7.0 (GLOVE) ×6 IMPLANT
GLOVE ECLIPSE 6.5 STRL STRAW (GLOVE) ×6 IMPLANT
GRASPER TIP-UP FEN DVNC XI (INSTRUMENTS) IMPLANT
HEMOSTAT ARISTA ABSORB 3G PWDR (HEMOSTASIS) ×2 IMPLANT
HOLDER FOLEY CATH W/STRAP (MISCELLANEOUS) IMPLANT
IRRIG SUCT STRYKERFLOW 2 WTIP (MISCELLANEOUS) ×2
IRRIGATION SUCT STRKRFLW 2 WTP (MISCELLANEOUS) ×2 IMPLANT
KIT PINK PAD W/HEAD ARE REST (MISCELLANEOUS) ×2
KIT PINK PAD W/HEAD ARM REST (MISCELLANEOUS) ×2 IMPLANT
KIT TURNOVER CYSTO (KITS) ×2 IMPLANT
LEGGING LITHOTOMY PAIR STRL (DRAPES) ×2 IMPLANT
NDL INSUFFLATION 14GA 120MM (NEEDLE) ×2 IMPLANT
NEEDLE INSUFFLATION 14GA 120MM (NEEDLE) ×2
OBTURATOR OPTICAL STND 8 DVNC (TROCAR) ×2
OBTURATOR OPTICALSTD 8 DVNC (TROCAR) ×2 IMPLANT
OCCLUDER COLPOPNEUMO (BALLOONS) ×2 IMPLANT
PACK ROBOT WH (CUSTOM PROCEDURE TRAY) ×2 IMPLANT
PACK ROBOTIC GOWN (GOWN DISPOSABLE) ×2 IMPLANT
PAD OB MATERNITY 4.3X12.25 (PERSONAL CARE ITEMS) ×2 IMPLANT
PAD PREP 24X48 CUFFED NSTRL (MISCELLANEOUS) ×2 IMPLANT
PROTECTOR NERVE ULNAR (MISCELLANEOUS) ×4 IMPLANT
RETRACTOR WND ALEXIS 18 MED (MISCELLANEOUS) IMPLANT
RTRCTR WOUND ALEXIS 18CM MED (MISCELLANEOUS) ×2
RTRCTR WOUND ALEXIS 18CM SML (INSTRUMENTS) ×2
SAVER CELL AAL HAEMONETICS (INSTRUMENTS) IMPLANT
SCISSORS MNPLR CVD DVNC XI (INSTRUMENTS) ×2 IMPLANT
SEAL UNIV 5-12 XI (MISCELLANEOUS) ×8 IMPLANT
SEALER VESSEL EXT DVNC XI (MISCELLANEOUS) IMPLANT
SET IRRIG Y TYPE TUR BLADDER L (SET/KITS/TRAYS/PACK) IMPLANT
SET TRI-LUMEN FLTR TB AIRSEAL (TUBING) ×2 IMPLANT
SLEEVE SCD COMPRESS KNEE MED (STOCKING) ×2 IMPLANT
SPIKE FLUID TRANSFER (MISCELLANEOUS) ×2 IMPLANT
SPONGE T-LAP 18X18 ~~LOC~~+RFID (SPONGE) IMPLANT
SUT VIC AB 0 CT1 36 (SUTURE) ×4 IMPLANT
SUT VIC AB 3-0 SH 27 (SUTURE) ×2
SUT VIC AB 3-0 SH 27X BRD (SUTURE) IMPLANT
SUT VIC AB 4-0 PS2 18 (SUTURE) ×4 IMPLANT
SUT VLOC 180 0 9IN GS21 (SUTURE) ×2 IMPLANT
TIP RUMI ORANGE 6.7MMX12CM (TIP) IMPLANT
TIP UTERINE 5.1X6CM LAV DISP (MISCELLANEOUS) IMPLANT
TIP UTERINE 6.7X10CM GRN DISP (MISCELLANEOUS) IMPLANT
TIP UTERINE 6.7X6CM WHT DISP (MISCELLANEOUS) IMPLANT
TIP UTERINE 6.7X8CM BLUE DISP (MISCELLANEOUS) IMPLANT
TOWEL OR 17X24 6PK STRL BLUE (TOWEL DISPOSABLE) ×2 IMPLANT
TROCAR PORT AIRSEAL 8X120 (TROCAR) ×2 IMPLANT
WATER STERILE IRR 1000ML POUR (IV SOLUTION) ×2 IMPLANT

## 2023-02-08 NOTE — Brief Op Note (Signed)
02/08/2023  1:46 PM  PATIENT:  Robin Cox  52 y.o. female  PRE-OPERATIVE DIAGNOSIS:  Uterine fibroid, iron deficiency anemia, menorrhagia with regular cycle, obesity  POST-OPERATIVE DIAGNOSIS:  Uterine fibroid, iron deficiency anemia, menorrhagia with regular cycle, obesity  PROCEDURE:  Procedure(s): XI ROBOTIC ASSISTED LAPAROSCOPIC HYSTERECTOMY AND BILATERAL SALPINGECTOMY AND LEFT OOPHORECTOMY (N/A) CYSTOSCOPY  SURGEON:  Surgeons and Role:    * Maxie Better, MD - Primary  PHYSICIAN ASSISTANT:   ASSISTANTS: Angelena Form RNFA   ANESTHESIA:   general Findings: large fibroid uterus, adherent left ovary, right ov with simple cyst, left side wall adhesions, nl tubes EBL:  200 mL   BLOOD ADMINISTERED:none  DRAINS: none   LOCAL MEDICATIONS USED:  MARCAINE     SPECIMEN:  Source of Specimen:  uterus ,cervix, tubes and left ovary  DISPOSITION OF SPECIMEN:  PATHOLOGY  COUNTS:  YES  TOURNIQUET:  * No tourniquets in log *  DICTATION: .Other Dictation: Dictation Number [id number]  PLAN OF CARE: Admit for overnight observation  PATIENT DISPOSITION:  PACU - hemodynamically stable.   Delay start of Pharmacological VTE agent (>24hrs) due to surgical blood loss or risk of bleeding: no

## 2023-02-08 NOTE — Anesthesia Postprocedure Evaluation (Signed)
Anesthesia Post Note  Patient: Robin Cox  Procedure(s) Performed: XI ROBOTIC ASSISTED LAPAROSCOPIC HYSTERECTOMY AND BILATERAL SALPINGECTOMY AND LEFT OOPHORECTOMY (Abdomen) CYSTOSCOPY (Bladder)     Patient location during evaluation: PACU Anesthesia Type: General Level of consciousness: awake and alert Pain management: pain level controlled Vital Signs Assessment: post-procedure vital signs reviewed and stable Respiratory status: spontaneous breathing, nonlabored ventilation and respiratory function stable Cardiovascular status: blood pressure returned to baseline Postop Assessment: no apparent nausea or vomiting Anesthetic complications: no   No notable events documented.  Last Vitals:  Vitals:   02/08/23 1430 02/08/23 1515  BP: 139/71 (!) 150/86  Pulse: 63 67  Resp: 16 20  Temp: 36.5 C 36.6 C  SpO2: 100% 99%    Last Pain:  Vitals:   02/08/23 1430  TempSrc:   PainSc: 7                  Shanda Howells

## 2023-02-08 NOTE — Anesthesia Procedure Notes (Signed)
Procedure Name: Intubation Date/Time: 02/08/2023 7:56 AM  Performed by: Francie Massing, CRNAPre-anesthesia Checklist: Patient identified, Emergency Drugs available, Suction available and Patient being monitored Patient Re-evaluated:Patient Re-evaluated prior to induction Oxygen Delivery Method: Circle system utilized Preoxygenation: Pre-oxygenation with 100% oxygen Induction Type: IV induction Ventilation: Mask ventilation without difficulty Laryngoscope Size: Mac and 3 Grade View: Grade I Tube type: Oral Tube size: 7.0 mm Number of attempts: 1 Airway Equipment and Method: Stylet and Oral airway Placement Confirmation: ETT inserted through vocal cords under direct vision, positive ETCO2 and breath sounds checked- equal and bilateral Secured at: 22 cm Tube secured with: Tape Dental Injury: Teeth and Oropharynx as per pre-operative assessment

## 2023-02-08 NOTE — Progress Notes (Signed)
Sister given patient update that pt doing well

## 2023-02-08 NOTE — H&P (Signed)
Robin Cox is an 52 y.o. female. G1P0010 SF presents for surgical mgmt of symptomatic uterine fibroids. Pt is scheduled for DaVinci robotic total hysterectomy, bilateral salpingectomy due to menorrhagia. Pt was treated with lupron therapy  Pertinent Gynecological History: Menses:  menorrhagia Bleeding: menorrhagia Contraception: none DES exposure: denies Blood transfusions: none Sexually transmitted diseases: no past history Previous GYN Procedures:  n/a   Last mammogram: normal Date: 2024 Last pap: normal Date: 2023 OB History: G1, P0010   Menstrual History: Menarche age: n/a Patient's last menstrual period was 09/10/2022 (approximate).    Past Medical History:  Diagnosis Date   Anemia    pt on iron pills   Anxiety    takes Atarax prn   Depression    resolved per pt   GERD (gastroesophageal reflux disease)    Headache(784.0)    h/a's prior to starting b/p medication - no problems since   Hypertension    no meds since wt loss   Morbid obesity (HCC)    Pre-diabetes    history of   Sleep apnea    no CPAP since wt loss    Past Surgical History:  Procedure Laterality Date   CHOLECYSTECTOMY N/A 08/02/2015   Procedure: LAPAROSCOPIC CHOLECYSTECTOMY WITH INTRAOPERATIVE CHOLANGIOGRAM;  Surgeon: Glenna Fellows, MD;  Location: [facility];  Service: General;  Laterality: N/A;   COLONOSCOPY  07/02/2022   DILATION AND CURETTAGE OF UTERUS  05/28/1988   ESOPHAGOGASTRODUODENOSCOPY N/A 12/30/2012   Procedure: ESOPHAGOGASTRODUODENOSCOPY (EGD);  Surgeon: Mariella Saa, MD;  Location: [facility];  Service: General;  Laterality: N/A;   LAPAROSCOPIC GASTRIC SLEEVE RESECTION N/A 12/30/2012   Procedure: LAPAROSCOPIC GASTRIC SLEEVE RESECTION;  Surgeon: Mariella Saa, MD;  Location: [facility];  Service: General;  Laterality: N/A;  Laparoscopic Sleeve Gastrectomy     Family History  Problem Relation Age of Onset   Allergies Sister    Hypertension Sister     Allergies Sister    Hypertension Brother    Allergies Brother    Stroke Brother    Breast cancer Maternal Aunt    Stomach cancer Maternal Grandmother    Pancreatic cancer Maternal Grandmother        pancreatic   Colon cancer Neg Hx    Colon polyps Neg Hx    Crohn's disease Neg Hx    Esophageal cancer Neg Hx    Ulcerative colitis Neg Hx     Social History:  reports that she has never smoked. She has never used smokeless tobacco. She reports that she does not currently use alcohol. She reports that she does not use drugs.  Allergies:  Allergies  Allergen Reactions   Augmentin [Amoxicillin-Pot Clavulanate] Nausea And Vomiting   Azithromycin Nausea And Vomiting    Got extremely hot     No medications prior to admission.    Review of Systems  All other systems reviewed and are negative.   Height 5\' 2"  (1.575 m), weight 95.7 kg, last menstrual period 09/10/2022. Physical Exam HENT:     Head: Atraumatic.  Eyes:     Extraocular Movements: Extraocular movements intact.  Cardiovascular:     Rate and Rhythm: Regular rhythm.     Heart sounds: Normal heart sounds.  Pulmonary:     Breath sounds: Normal breath sounds.  Abdominal:     Comments: Uterus 3 fb below umb  Genitourinary:    General: Normal vulva.     Comments: Cervix deviated to left Posterior palpable fibroid Uterus 16 wk size Musculoskeletal:  Cervical back: Neck supple.  Skin:    General: Skin is warm and dry.  Neurological:     General: No focal deficit present.     Mental Status: She is alert and oriented to person, place, and time.  Psychiatric:        Mood and Affect: Mood normal.        Behavior: Behavior normal.     No results found for this or any previous visit (from the past 24 hour(s)).  No results found.  Assessment/Plan: IMP: symptomatic uterine fibroids IDA P) Davinci robotic total hysterectomy, bilateral salpingectomy. Procedure explained. Risk of surgery reviewed including  infection, bleeding, injury to bladder, bowel, ureter, internal scar tissue, possible need for blood transfusion and its risk, thermal injury. Poss need to convert to open case, poss need for removal of ovaries, need for morcellation of fibroid uterus. Poss need for surgery in the future for ovarian disease if ovaries remain. All ? answered  Jackqulyn Mendel A Keyonte Cookston 02/08/2023, 4:11 AM

## 2023-02-08 NOTE — Interval H&P Note (Signed)
History and Physical Interval Note:  02/08/2023 7:43 AM  Robin Cox  has presented today for surgery, with the diagnosis of Uterine fibroid, iron deficiency anemia, menorrhagia with regular cycle.  The various methods of treatment have been discussed with the patient and family. After consideration of risks, benefits and other options for treatment, the patient has consented to  Procedure(s): XI ROBOTIC ASSISTED LAPAROSCOPIC HYSTERECTOMY AND SALPINGECTOMY (N/A) as a surgical intervention.  The patient's history has been reviewed, patient examined, no change in status, stable for surgery.  I have reviewed the patient's chart and labs.  Questions were answered to the patient's satisfaction.     Avangeline Stockburger A Aayra Hornbaker

## 2023-02-08 NOTE — Transfer of Care (Signed)
Immediate Anesthesia Transfer of Care Note  Patient: Robin Cox  Procedure(s) Performed: Procedure(s) (LRB): XI ROBOTIC ASSISTED LAPAROSCOPIC HYSTERECTOMY AND BILATERAL SALPINGECTOMY AND LEFT OOPHORECTOMY (N/A) CYSTOSCOPY  Patient Location: PACU  Anesthesia Type: GA  Level of Consciousness: awake, sedated, patient cooperative and responds to stimulation  Airway & Oxygen Therapy: Patient Spontanous Breathing and Patient connected to [place] oxygen  Post-op Assessment: Report given to PACU RN, Post -op Vital signs reviewed and stable and Patient moving all extremities  Post vital signs: Reviewed and stable  Complications: No apparent anesthesia complications

## 2023-02-08 NOTE — Op Note (Signed)
I was called to consult during Dr. Cherly Hensen procedure. There was a peritoneal violation near the distal sigmoid colon. I assessed the area with robotic instruments. It did not appeared to be a bowel injury or serosal tear. Rather it was along the left lateral portion of the mesocolon.

## 2023-02-09 DIAGNOSIS — D251 Intramural leiomyoma of uterus: Secondary | ICD-10-CM | POA: Diagnosis not present

## 2023-02-09 LAB — CBC
HCT: 25.4 % — ABNORMAL LOW (ref 36.0–46.0)
Hemoglobin: 7.3 g/dL — ABNORMAL LOW (ref 12.0–15.0)
MCH: 22.1 pg — ABNORMAL LOW (ref 26.0–34.0)
MCHC: 28.7 g/dL — ABNORMAL LOW (ref 30.0–36.0)
MCV: 77 fL — ABNORMAL LOW (ref 80.0–100.0)
Platelets: 260 10*3/uL (ref 150–400)
RBC: 3.3 MIL/uL — ABNORMAL LOW (ref 3.87–5.11)
RDW: 19.2 % — ABNORMAL HIGH (ref 11.5–15.5)
WBC: 13.5 10*3/uL — ABNORMAL HIGH (ref 4.0–10.5)
nRBC: 0 % (ref 0.0–0.2)

## 2023-02-09 LAB — BASIC METABOLIC PANEL
Anion gap: 7 (ref 5–15)
BUN: 8 mg/dL (ref 6–20)
CO2: 24 mmol/L (ref 22–32)
Calcium: 8.4 mg/dL — ABNORMAL LOW (ref 8.9–10.3)
Chloride: 104 mmol/L (ref 98–111)
Creatinine, Ser: 1.09 mg/dL — ABNORMAL HIGH (ref 0.44–1.00)
GFR, Estimated: >60 mL/min (ref >60)
Glucose, Bld: 119 mg/dL — ABNORMAL HIGH (ref 70–99)
Potassium: 4.2 mmol/L (ref 3.5–5.1)
Sodium: 135 mmol/L (ref 135–145)

## 2023-02-09 MED ORDER — OXYCODONE HCL 5 MG PO TABS: 5.0000 mg | ORAL_TABLET | ORAL | 0 refills | Status: AC | PRN

## 2023-02-09 MED ORDER — IBUPROFEN 200 MG PO TABS
ORAL_TABLET | ORAL | Status: AC
  Filled 2023-02-09: qty 3

## 2023-02-09 MED ORDER — OXYCODONE HCL 5 MG PO TABS
ORAL_TABLET | ORAL | Status: AC
  Filled 2023-02-09: qty 1

## 2023-02-09 MED ORDER — ACETAMINOPHEN 500 MG PO TABS
ORAL_TABLET | ORAL | Status: AC
  Filled 2023-02-09: qty 2

## 2023-02-09 MED ORDER — ACETAMINOPHEN 500 MG PO TABS: 1000.0000 mg | ORAL_TABLET | Freq: Four times a day (QID) | ORAL | 0 refills | Status: AC

## 2023-02-09 MED ORDER — METRONIDAZOLE 500 MG PO TABS: 500.0000 mg | ORAL_TABLET | Freq: Two times a day (BID) | ORAL | 0 refills | Status: AC

## 2023-02-09 NOTE — Discharge Instructions (Signed)
Call if temperature greater than equal to 100.4, nothing per vagina for 4-6 weeks or severe nausea vomiting, increased incisional pain , drainage or redness in the incision site, no straining with bowel movements, showers no bath °

## 2023-02-09 NOTE — Op Note (Signed)
Robin Cox, Robin Cox MEDICAL RECORD NO: [id number] ACCOUNT NO: [id number] DATE OF BIRTH: 25-Oct-1970 FACILITY: [facility] LOCATION: [facility] PHYSICIAN: Demarie Hyneman A. Cherly Hensen, MD  Operative Report   DATE OF PROCEDURE: 02/08/2023  PREOPERATIVE DIAGNOSIS:  Symptomatic uterine fibroids.  PROCEDURE:  Da Vinci robotic total hysterectomy, bilateral salpingectomy, left oophorectomy, cystoscopy.  POSTOPERATIVE DIAGNOSES:  Intramural, subserosal and submucosal fibroids, menorrhagia, morbid obesity.  ANESTHESIA:  General.  SURGEON:  Maxie Better, MD  ASSISTANT:  Karmen Stabs, RNFA  DESCRIPTION OF PROCEDURE:  Under adequate general anesthesia, the patient was placed in the dorsal lithotomy position.  She was positioned for robotic surgery.  The patient's history is notable for a previous laparoscopic gastric sleeve surgery.  Her  abdominal exam suggested the uterus was at least 16-week size, despite an ultrasound that had a different size.  The patient was sterilely prepped and draped in the usual fashion.  A 3-way Foley catheter was sterilely placed.  Weighted speculum was  placed in the vagina, retractor was placed anteriorly.  The patient had a very narrow but elongated vagina.  The cervix had a shortened posterior lip.  Nonetheless, a 0 Vicryl figure-of-eight suture was placed on the anterior and posterior lip of the  cervix.  The uterus sounded to 9 cm.  Using a small RUMI KOH cup and a #8 uterine manipulator, the manipulator and the cup was placed and the retractor was removed.  Attention was then turned to the abdomen. The concern for anterior abdominal wall  adhesions, a left upper quadrant mid axillary Daft's point prior incision site was injected with 0.25% Marcaine, made a small incision and the Optiview was inserted in that port site.  Several attempts at entry into the abdomen was unsuccessful.  At  that point, the Optiview was removed.  0.25% Marcaine was injected  supraumbilically.  A small vertical incision was then made with gas showing disproportionate appearance of the abdominal distention.  The robotic trocar was then inserted in the midline and the Veress  needle was inserted and tested with normal saline.  Opening pressure of 4 was noted.  Three liters of CO2 was insufflated.  At that point, Veress needle was removed.  Optiview was then reinserted in the left upper quadrant, with entry into the abdomen  was then achieved, with panoramic inspection revealing no adhesions in the anterior abdominal wall or upper abdomen.  The patient was placed in the deep Trendelenburg position and the robotic camera port was then inserted under direct visualization and  the robotic camera was then inserted.  The left upper quadrant port site incision was extended and the 8 mm AirSeal port was then placed, also under direct visualization.  Subsequently, other robotic ports were then placed, two 9 cm from the midline on  the right and one for the left, in the left lateral aspect of the abdomen.  Inspection of the pelvis was notable for a uterus that was enlarged with multiple fibroids, adhesion of the uterus to the left lateral wall, bowel adhesion to the left sidewall,  inability to view the ovary on the left, a portion of the tube was partially seen on the left side obscured by the adhesions.  The right side, the ovary could be seen. The right tube was also hidden. With the ports finally being in place, the robot was  docked.  In arm #1, the vessel sealer was placed. In arm #4, the monopolar scissors was placed and in arm #3, the bipolar long grasper was then placed.  With that being done, I then went to the surgical console.  At the surgical console, the left side  was the area of interest.  The ureter was noted to be deep in the pelvis.  Realigning the uterus to assess where the correct anatomy would be on the left was first done.  The adhesions on the left were lysed. The left  tube was finally identified.  The  left ovary was plastered against the uterus.  The procedure was started with the proximal portion of the left fallopian tube was grasped with the vessel sealer and the grasper and the mesosalpinx was serially clamped, cauterized and cut using the vessel  sealer.  The tube was then removed. Decision was then made at that point based on the appearance of the ovary and the patient's age, not to try to save that ovary.  The next was the opening of the left retroperitoneal space and the medial leaf of the  broad ligament was opened, the left ovarian vessels, IP ligament was then clamped, cauterized and cut, followed by the round ligament on the left, which was serially clamped, cauterized and cut, bringing the anterior leaf of the broad ligament, opening  and sharp dissection of the vesicouterine peritoneum transversely was then accomplished.  It was then noted that the ovary was attached to the lower area where the uterine vessels were. Due to the bulkiness of the uterus, it was difficult to use the  uterine manipulator to move the uterus to better achieve visibility of the uterine vessels on the left.  At that point, decision was then made to go to the opposite side where the ureter was identified.  The right fallopian tube was finally found and it  was also removed.  The retroperitoneal space on the right was opened, the right uteroovarian ligament was then serially clamped, cauterized and cut, saving that right ovary, which appears to have a simple cyst.  The remaining portion of the vesicouterine  peritoneum was opened transversely on that right side.  Again, the attempt to open the posterior leaf of the broad ligament on the right to visualize the ureter was difficult. Bleeding occurred.  The vessels were subsequently identified, the right  uterine vessels, they were clamped, cauterized and ultimately cut.  The bladder was displaced inferiorly.  The retroperitoneal space  was inspected. Anteriorly the vaginal cuff was opened proximally in a transverse fashion with the hope to reach the uterine  vessels, particularly on the left as well.  Once this was done, the attention was then turned back to the left side where the ovary was sharply dissected off its attachment to the uterine vessel on the left side.  Once that ovary was removed, there was a  small piece of it remained and that was removed separately.  Uterine vessels were then clamped, cauterized and cut.  During the case, the 0 angle camera was switched to the 30 degree scope in order to help facilitate the view, given the uterine size.  The cervicovaginal junction was circumferentially cut and finally the posterior aspect of the uterus was able to be seen.  The vessel sealer was switched out to the single-tooth tenaculum robotic instrument in order to help  lift the uterus up to view posteriorly and to open the posterior cervicovaginal junction.  Once that was able to be achieved, the uterus was finally detached from its attachment to the vagina.  Initial attempt to remove the specimen through the vagina  was unsuccessful as  the specimen was quite large.  I went to the patient's bedside vaginally to attempt that. The uterine manipulator was removed.  Coring of the specimen was also limited by the length and narrowness of the vagina.  An Alexis retractor was placed  vaginally in order to protect the vaginal wall. Pieces of the cervix was removed however,attempts to core the specimen further was limited by distance.  Decision was made to then go back to the surgical console.  At the surgical console, the robotic hook was used to not only split the specimen in the midline, but  also used to remove several fibroids in order to debulk the uterus.  I then went back again to the vagina and with also coring out was able to subsequently remove both pieces of the uterus along with the myomas that had been separately removed.  One  loose larger fibroid could not be found. The vaginal insufflator was inserted.  I went back to the surgical console, gently looked in the left upper quadrant and was able to find the large fibroid, brought it down to the vaginal cuff and that was then removed vaginally.  Due to the manipulations and morcellation of the fibroid, the bowel was checked for any defects.  There appears to be serosal surface tears, at which time general consult intraoperative was requested. Dr.Kinsinger came in and looked robotically and noted that there was no area for concern on the bowel. Due to difficulty getting the uterine vessels, decision was made to perform a cystoscopy. The cystoscope was inserted using 70 degree angle and both ureteral jets were noted to be jetting out urine quite  clearly and at that point, the cystoscope was removed.  The Foley was then reinserted.  I went back to the surgical console and closed the vaginal cuff with 0 V-Loc suture.  Abdomen was then irrigated and suctioned of debris.  The pressure was reduced in  order to look for any bleeding vessels.  Good hemostasis was noted.  The Arista potato starch was then utilized for hemostasis to place on the vaginal cuff.  Once that was done, the instruments were removed.  The robot was undocked and the ports were  then removed under direct visualization, abdomen having been deflated.  The vaginal cuff intraoperatively had been checked digitally.  I checked the cuff digitally and vaginally at the end of the case. The skin incisions were closed with 4-0 Vicryl subcuticular sutures.  SPECIMEN:  The morcellated uterus, cervix, fallopian tube, left ovary, weighing 561 grams was sent to pathology.  INTRAOPERATIVE FLUIDS:  Two liters.  URINE OUTPUT: 200 ml.  ESTIMATED BLOOD LOSS:  200 ml .  COMPLICATIONS:  None.  The patient tolerated the procedure well and was transferred to recovery room in stable condition.   SHW D: 02/09/2023 9:24:38 pm T:  02/09/2023 11:07:00 pm  JOB: [id number]/ [id number]

## 2023-02-09 NOTE — Progress Notes (Signed)
Subjective: Patient reports {sub:3041132}.    Objective: I have reviewed patient's vital signs.  {reviewed:3041135}. Vitals:   02/09/23 0223 02/09/23 0615  BP: 107/67 111/65  Pulse: (!) 59 60  Resp: (!) 22 18  Temp: 98.1 F (36.7 C) 98.2 F (36.8 C)  SpO2: 98% 98%   I/O last 3 completed shifts: In: 7152.5 [P.O.:850; I.V.:5762.5; IV Piggyback:540] Out: 1325 [Urine:1125; Blood:200] No intake/output data recorded.  Lab Results  Component Value Date   WBC 13.5 (H) 02/09/2023   HGB 7.3 (L) 02/09/2023   HCT 25.4 (L) 02/09/2023   MCV 77.0 (L) 02/09/2023   PLT 260 02/09/2023   Lab Results  Component Value Date   CREATININE 1.09 (H) 02/09/2023    EXAM General: {Exam; general:16600} Resp: {Exam; lung:16931} Cardio: {Exam; heart:5510} GI: {Exam, GM:0102725} Extremities: {Exam; extremity:5109} Vaginal Bleeding: {exam; vaginal bleeding:3041122}  Assessment: s/p Procedure(s): XI ROBOTIC ASSISTED LAPAROSCOPIC HYSTERECTOMY AND BILATERAL SALPINGECTOMY AND LEFT OOPHORECTOMY CYSTOSCOPY: {assessment details:3041134}  Plan: {DGUY:4034742}  LOS: 0 days    Serita Kyle, MD 02/09/2023 7:32 AM    02/09/2023, 7:32 AM

## 2023-02-11 ENCOUNTER — Encounter (HOSPITAL_BASED_OUTPATIENT_CLINIC_OR_DEPARTMENT_OTHER): Payer: Self-pay | Admitting: Obstetrics and Gynecology

## 2023-02-11 LAB — SURGICAL PATHOLOGY

## 2023-11-07 NOTE — Progress Notes (Signed)
 Chief Complaint: nausea and vomiting Primary GI MD: Dr. General Kenner  HPI: Robin Cox is a 53 year old female who presents with episodes of nausea and vomiting.  She has experienced two episodes of nausea and vomiting after consuming greasy foods. The first episode occurred after taking sinus medication and eating lunch, resulting in warmth and subsequent vomiting. The second episode happened the following week under similar circumstances with different medications. No further episodes have been reported.  No heartburn or abdominal pain, but there is occasional epigastric discomfort when eating. She denies overeating during these episodes but acknowledges eating quickly, which may exacerbate her symptoms.  She has a history of sleeve gastrectomy and has been on Wegovy since last year. She also has a history of gallbladder removal and experiences loose stools associated with fatty and greasy foods, attributing this to her surgery. Greasy foods often prompt bowel movements.  She has chronic iron deficiency anemia and is currently on iron supplements. No blood in stool and she is up to date on her colonoscopy. Her last blood draw indicated low anemia levels, which she believes was before her surgery for fibroids   PREVIOUS GI WORKUP   Colonoscopy 06/2022 for screening - Diverticulosis in the transverse colon and in the left colon.  - Anal papilla( e) were hypertrophied. - Internal hemorrhoids.  - The examination was otherwise normal.  - No polyps - Repeat 10 years  Past Medical History:  Diagnosis Date   Anemia    pt on iron pills   Anxiety    takes Atarax prn   Depression    resolved per pt   GERD (gastroesophageal reflux disease)    Headache(784.0)    h/a's prior to starting b/p medication - no problems since   Hypertension    no meds since wt loss   Morbid obesity (HCC)    Pre-diabetes    history of   Sleep apnea    no CPAP since wt loss    Past Surgical History:   Procedure Laterality Date   CHOLECYSTECTOMY N/A 08/02/2015   Procedure: LAPAROSCOPIC CHOLECYSTECTOMY WITH INTRAOPERATIVE CHOLANGIOGRAM;  Surgeon: Ayesha Lente, MD;  Location: [facility];  Service: General;  Laterality: N/A;   COLONOSCOPY  07/02/2022   CYSTOSCOPY  02/08/2023   Procedure: CYSTOSCOPY;  Surgeon: Ivery Marking, MD;  Location: [facility];  Service: Gynecology;;   DILATION AND CURETTAGE OF UTERUS  05/28/1988   ESOPHAGOGASTRODUODENOSCOPY N/A 12/30/2012   Procedure: ESOPHAGOGASTRODUODENOSCOPY (EGD);  Surgeon: Quitman Bucy, MD;  Location: [facility];  Service: General;  Laterality: N/A;   LAPAROSCOPIC GASTRIC SLEEVE RESECTION N/A 12/30/2012   Procedure: LAPAROSCOPIC GASTRIC SLEEVE RESECTION;  Surgeon: Quitman Bucy, MD;  Location: [facility];  Service: General;  Laterality: N/A;  Laparoscopic Sleeve Gastrectomy    ROBOTIC ASSISTED LAPAROSCOPIC HYSTERECTOMY AND SALPINGECTOMY N/A 02/08/2023   Procedure: XI ROBOTIC ASSISTED LAPAROSCOPIC HYSTERECTOMY AND BILATERAL SALPINGECTOMY AND LEFT OOPHORECTOMY;  Surgeon: Ivery Marking, MD;  Location: [facility];  Service: Gynecology;  Laterality: N/A;    Current Outpatient Medications  Medication Sig Dispense Refill   acetaminophen  (TYLENOL ) 500 MG tablet Take 2 tablets (1,000 mg total) by mouth every 6 (six) hours. 30 tablet 0   Cyanocobalamin (VITAMIN B-12 PO) Take by mouth daily.     loratadine (CLARITIN) 10 MG tablet Take 10 mg by mouth daily.     metroNIDAZOLE  (FLAGYL ) 500 MG tablet Take 1 tablet (500 mg total) by mouth 2 (two) times daily. 14  tablet 0   MULTIPLE VITAMIN PO Take by mouth daily.     Semaglutide-Weight Management (WEGOVY [place]) Inject into the skin.     omeprazole (PRILOSEC) 20 MG capsule Take 1 capsule (20 mg total) by mouth daily. 30 capsule 3   No current facility-administered medications for this visit.    Allergies as of 11/08/2023 - Review Complete  11/08/2023  Allergen Reaction Noted   Augmentin  [amoxicillin -pot clavulanate] Nausea And Vomiting 09/23/2012   Azithromycin Nausea And Vomiting 05/22/2012    Family History  Problem Relation Age of Onset   Allergies Sister    Hypertension Sister    Allergies Sister    Hypertension Brother    Allergies Brother    Stroke Brother    Breast cancer Maternal Aunt    Stomach cancer Maternal Grandmother    Pancreatic cancer Maternal Grandmother        pancreatic   Colon cancer Neg Hx    Colon polyps Neg Hx    Crohn's disease Neg Hx    Esophageal cancer Neg Hx    Ulcerative colitis Neg Hx     Social History   Socioeconomic History   Marital status: Single    Spouse name: Not on file   Number of children: 0   Years of education: Not on file   Highest education level: Not on file  Occupational History   Not on file  Tobacco Use   Smoking status: Never   Smokeless tobacco: Never  Vaping Use   Vaping status: Never Used  Substance and Sexual Activity   Alcohol use: Not Currently   Drug use: No   Sexual activity: Not Currently    Birth control/protection: None  Other Topics Concern   Not on file  Social History Narrative   Not on file   Social Drivers of Health   Financial Resource Strain: Low Risk  (07/08/2023)   Received from [facility]   Overall Financial Resource Strain (CARDIA)    Difficulty of Paying Living Expenses: Not hard at all  Food Insecurity: No Food Insecurity (07/08/2023)   Received from [facility]   Hunger Vital Sign    Within the past 12 months, you worried that your food would run out before you got the money to buy more.: Never true    Within the past 12 months, the food you bought just didn't last and you didn't have money to get more.: Never true  Transportation Needs: No Transportation Needs (07/08/2023)   Received from [facility] - Transportation    Lack of Transportation (Medical): No    Lack of Transportation (Non-Medical):  No  Physical Activity: Not on file  Stress: Not on file  Social Connections: Unknown (07/18/2022)   Received from [facility]   Social Network    Social Network: Not on file  Intimate Partner Violence: Unknown (07/18/2022)   Received from [facility]   HITS    Physically Hurt: Not on file    Insult or Talk Down To: Not on file    Threaten Physical Harm: Not on file    Scream or Curse: Not on file    Review of Systems:    Constitutional: No weight loss, fever, chills, weakness or fatigue HEENT: Eyes: No change in vision               Ears, Nose, Throat:  No change in hearing or congestion Skin: No rash or itching Cardiovascular: No chest pain, chest pressure or palpitations  Respiratory: No SOB or cough Gastrointestinal: See HPI and otherwise negative Genitourinary: No dysuria or change in urinary frequency Neurological: No headache, dizziness or syncope Musculoskeletal: No new muscle or joint pain Hematologic: No bleeding or bruising Psychiatric: No history of depression or anxiety    Physical Exam:  Vital signs: BP 116/84   Pulse 68   Ht 5' 2 (1.575 m)   Wt 198 lb (89.8 kg)   BMI 36.21 kg/m   Constitutional: NAD, alert and cooperative Head:  Normocephalic and atraumatic. Eyes:   PEERL, EOMI. No icterus. Conjunctiva pink. Respiratory: Respirations even and unlabored. Lungs clear to auscultation bilaterally.   No wheezes, crackles, or rhonchi.  Cardiovascular:  Regular rate and rhythm. No peripheral edema, cyanosis or pallor.  Gastrointestinal:  Soft, nondistended, nontender. No rebound or guarding. Normal bowel sounds. No appreciable masses or hepatomegaly. Rectal:  Declines Msk:  Symmetrical without gross deformities. Without edema, no deformity or joint abnormality.  Neurologic:  Alert and  oriented x4;  grossly normal neurologically.  Skin:   Dry and intact without significant lesions or rashes. Psychiatric: Oriented to person, place and time. Demonstrates  good judgement and reason without abnormal affect or behaviors.  RELEVANT LABS AND IMAGING: CBC    Component Value Date/Time   WBC 13.5 (H) 02/09/2023 0301   RBC 3.30 (L) 02/09/2023 0301   HGB 7.3 (L) 02/09/2023 0301   HCT 25.4 (L) 02/09/2023 0301   PLT 260 02/09/2023 0301   MCV 77.0 (L) 02/09/2023 0301   MCH 22.1 (L) 02/09/2023 0301   MCHC 28.7 (L) 02/09/2023 0301   RDW 19.2 (H) 02/09/2023 0301   RDW 15.1 07/08/2013 1034   LYMPHSABS 3.4 (H) 07/08/2013 1034   MONOABS 0.5 02/22/2013 1534   EOSABS 0.1 07/08/2013 1034   BASOSABS 0.0 07/08/2013 1034    CMP     Component Value Date/Time   NA 135 02/09/2023 0301   NA 135 07/08/2013 1034   K 4.2 02/09/2023 0301   CL 104 02/09/2023 0301   CO2 24 02/09/2023 0301   GLUCOSE 119 (H) 02/09/2023 0301   BUN 8 02/09/2023 0301   BUN 16 07/08/2013 1034   CREATININE 1.09 (H) 02/09/2023 0301   CREATININE 1.06 01/22/2013 1333   CALCIUM 8.4 (L) 02/09/2023 0301   PROT 6.4 07/08/2013 1034   ALBUMIN  3.8 07/08/2013 1034   AST 18 07/08/2013 1034   ALT 9 07/08/2013 1034   ALKPHOS 47 07/08/2013 1034   BILITOT <0.2 07/08/2013 1034   GFRNONAA >60 02/09/2023 0301   GFRAA 92 07/08/2013 1034     Assessment/Plan:   Nausea and vomiting GERD 2 episodes of nausea/vomiting with eating and taking medication. On maintenance dose of Wegovy for the last 4 months.  S/p cholecystectomy. History of sleeve gastrectomy.  Questionable GERD.  Also reports eating very quickly. Suspect nausea vomiting may have been due to quick eating/overeating versus gastroparesis from [facility] and sleeve gastrectomy.  Also possible GERD.  She reports occasional epigastric pain with eating as well.  Not on an antacid. - Trial of omeprazole 20 Mg once daily - Educated patient on 5-6 small meals per day and eating slowly - Avoid fatty/greasy foods - Education on GERD and provided patient education handouts - If persistent symptoms could consider EGD for further evaluation    Loose stools Loose stools associated with fatty/greasy foods since having her gallbladder taken out.  Suspect bile acid diarrhea. - Can consider colestipol if this becomes a problem for her.  Colon cancer screening Colonoscopy  06/2022, normal, repeat 10 years - Repeat 2034  History of sleeve gastrectomy 2014 Recently seen by bariatrics for weight loss and put on Wegovy 2024  Anemia Chronic iron deficiency anemia likely secondary to history of sleeve gastrectomy.  Acutely exacerbated by recent hysterectomy bilateral salpingectomy and left oophorectomy for history of fibroids.  On iron supplement.  No overt GI bleeding. - CBC/CMP - B12/folate and iron studies/ferritin   Yoshiko Keleher [facility] 11/08/2023, 8:55 AM  Cc: Ivery Marking, MD

## 2023-11-08 ENCOUNTER — Other Ambulatory Visit (INDEPENDENT_AMBULATORY_CARE_PROVIDER_SITE_OTHER)

## 2023-11-08 ENCOUNTER — Ambulatory Visit: Admitting: Gastroenterology

## 2023-11-08 ENCOUNTER — Encounter: Payer: Self-pay | Admitting: Gastroenterology

## 2023-11-08 VITALS — BP 116/84 | HR 68 | Ht 62.0 in | Wt 198.0 lb

## 2023-11-08 DIAGNOSIS — D509 Iron deficiency anemia, unspecified: Secondary | ICD-10-CM

## 2023-11-08 DIAGNOSIS — R197 Diarrhea, unspecified: Secondary | ICD-10-CM

## 2023-11-08 DIAGNOSIS — R112 Nausea with vomiting, unspecified: Secondary | ICD-10-CM

## 2023-11-08 DIAGNOSIS — K219 Gastro-esophageal reflux disease without esophagitis: Secondary | ICD-10-CM

## 2023-11-08 DIAGNOSIS — R1013 Epigastric pain: Secondary | ICD-10-CM

## 2023-11-08 DIAGNOSIS — Z9049 Acquired absence of other specified parts of digestive tract: Secondary | ICD-10-CM

## 2023-11-08 LAB — CBC WITH DIFFERENTIAL/PLATELET
Basophils Absolute: 0 10*3/uL (ref 0.0–0.1)
Basophils Relative: 0.2 % (ref 0.0–3.0)
Eosinophils Absolute: 0 10*3/uL (ref 0.0–0.7)
Eosinophils Relative: 0.6 % (ref 0.0–5.0)
HCT: 33.3 % — ABNORMAL LOW (ref 36.0–46.0)
Hemoglobin: 10.4 g/dL — ABNORMAL LOW (ref 12.0–15.0)
Lymphocytes Relative: 31.1 % (ref 12.0–46.0)
Lymphs Abs: 2.1 10*3/uL (ref 0.7–4.0)
MCHC: 31.4 g/dL (ref 30.0–36.0)
MCV: 75.1 fl — ABNORMAL LOW (ref 78.0–100.0)
Monocytes Absolute: 0.5 10*3/uL (ref 0.1–1.0)
Monocytes Relative: 7.3 % (ref 3.0–12.0)
Neutro Abs: 4.1 10*3/uL (ref 1.4–7.7)
Neutrophils Relative %: 60.8 % (ref 43.0–77.0)
Platelets: 285 10*3/uL (ref 150.0–400.0)
RBC: 4.43 Mil/uL (ref 3.87–5.11)
RDW: 18.7 % — ABNORMAL HIGH (ref 11.5–15.5)
WBC: 6.8 10*3/uL (ref 4.0–10.5)

## 2023-11-08 LAB — COMPREHENSIVE METABOLIC PANEL WITH GFR
ALT: 6 U/L (ref 0–35)
AST: 14 U/L (ref 0–37)
Albumin: 3.6 g/dL (ref 3.5–5.2)
Alkaline Phosphatase: 53 U/L (ref 39–117)
BUN: 4 mg/dL — ABNORMAL LOW (ref 6–23)
CO2: 29 meq/L (ref 19–32)
Calcium: 9 mg/dL (ref 8.4–10.5)
Chloride: 104 meq/L (ref 96–112)
Creatinine, Ser: 0.88 mg/dL (ref 0.40–1.20)
GFR: 75.66 mL/min (ref >60.00)
Glucose, Bld: 76 mg/dL (ref 70–99)
Potassium: 4 meq/L (ref 3.5–5.1)
Sodium: 138 meq/L (ref 135–145)
Total Bilirubin: 0.2 mg/dL (ref 0.2–1.2)
Total Protein: 7.1 g/dL (ref 6.0–8.3)

## 2023-11-08 MED ORDER — OMEPRAZOLE 20 MG PO CPDR: 20.0000 mg | DELAYED_RELEASE_CAPSULE | Freq: Every day | ORAL | 3 refills | Status: DC

## 2023-11-08 NOTE — Patient Instructions (Addendum)
 We have sent the following medications to your pharmacy for you to pick up at your convenience: Omeprazole 20mg  once daily  Your provider has requested that you go to the basement level for lab work before leaving today. Press B on the elevator. The lab is located at the first door on the left as you exit the elevator.  Please follow up sooner if symptoms increase or worsen  Due to recent changes in healthcare laws, you may see the results of your imaging and laboratory studies on MyChart before your provider has had a chance to review them.  We understand that in some cases there may be results that are confusing or concerning to you. Not all laboratory results come back in the same time frame and the provider may be waiting for multiple results in order to interpret others.  Please give us  48 hours in order for your provider to thoroughly review all the results before contacting the office for clarification of your results.   Thank you for trusting me with your gastrointestinal care!   Bayley McMichael, PA-C _______________________________________________________  If your blood pressure at your visit was 140/90 or greater, please contact your primary care physician to follow up on this.  _______________________________________________________  If you are age 57 or older, your body mass index should be between 23-30. Your Body mass index is 36.21 kg/m. If this is out of the aforementioned range listed, please consider follow up with your Primary Care Provider.  If you are age 32 or younger, your body mass index should be between 19-25. Your Body mass index is 36.21 kg/m. If this is out of the aformentioned range listed, please consider follow up with your Primary Care Provider.   ________________________________________________________  The [facility] providers would like to encourage you to use MYCHART to communicate with providers for non-urgent requests or questions.  Due to long hold  times on the telephone, sending your provider a message by [facility] may be a faster and more efficient way to get a response.  Please allow 48 business hours for a response.  Please remember that this is for non-urgent requests.  _______________________________________________________

## 2023-11-08 NOTE — Progress Notes (Signed)
 Robin Cox - given this patients chronic IDA, EGD is recommended if she has not had one before.   If she is willing can you please schedule for EGD at the [facility]. Thanks

## 2023-11-09 LAB — IBC + FERRITIN
Ferritin: 4.3 ng/mL — ABNORMAL LOW (ref 10.0–291.0)
Iron: 32 ug/dL — ABNORMAL LOW (ref 42–145)
Saturation Ratios: 8.1 % — ABNORMAL LOW (ref 20.0–50.0)
TIBC: 396.2 ug/dL (ref 250.0–450.0)
Transferrin: 283 mg/dL (ref 212.0–360.0)

## 2023-11-09 LAB — B12 AND FOLATE PANEL
Folate: 11.3 ng/mL (ref >5.9)
Vitamin B-12: 264 pg/mL (ref 211–911)

## 2023-11-11 ENCOUNTER — Ambulatory Visit: Payer: Self-pay | Admitting: Gastroenterology

## 2023-11-11 DIAGNOSIS — R112 Nausea with vomiting, unspecified: Secondary | ICD-10-CM

## 2023-11-11 DIAGNOSIS — K219 Gastro-esophageal reflux disease without esophagitis: Secondary | ICD-10-CM

## 2023-11-11 DIAGNOSIS — R1013 Epigastric pain: Secondary | ICD-10-CM

## 2023-11-11 DIAGNOSIS — D509 Iron deficiency anemia, unspecified: Secondary | ICD-10-CM

## 2024-01-05 ENCOUNTER — Encounter: Payer: Self-pay | Admitting: Gastroenterology

## 2024-01-07 ENCOUNTER — Telehealth: Payer: Self-pay | Admitting: Gastroenterology

## 2024-01-07 NOTE — Telephone Encounter (Signed)
 Good Morning Dr. Leigh,  Patient called stating that she wanted to cancel her EGD with you on 8/15 at  9:30 and wanted to proceed with her follow up on 8/22 at 9:30 with Elida Nyle Sharps.

## 2024-01-07 NOTE — Telephone Encounter (Signed)
 Okay, sorry to hear this. Did she say why she is cancelling the EGD?

## 2024-01-07 NOTE — Telephone Encounter (Signed)
Okay got it, thanks

## 2024-01-07 NOTE — Telephone Encounter (Signed)
 Dr. Leigh,   She just said she wished to cancel and wanted to proceed with her follow up.

## 2024-01-10 ENCOUNTER — Encounter: Admitting: Gastroenterology

## 2024-01-17 ENCOUNTER — Other Ambulatory Visit

## 2024-01-17 ENCOUNTER — Encounter: Payer: Self-pay | Admitting: Nurse Practitioner

## 2024-01-17 ENCOUNTER — Ambulatory Visit: Admitting: Nurse Practitioner

## 2024-01-17 VITALS — BP 130/80 | HR 74 | Ht 61.0 in | Wt 194.5 lb

## 2024-01-17 DIAGNOSIS — E538 Deficiency of other specified B group vitamins: Secondary | ICD-10-CM | POA: Diagnosis not present

## 2024-01-17 DIAGNOSIS — D509 Iron deficiency anemia, unspecified: Secondary | ICD-10-CM

## 2024-01-17 LAB — IBC + FERRITIN
Ferritin: 3.6 ng/mL — ABNORMAL LOW (ref 10.0–291.0)
Iron: 46 ug/dL (ref 42–145)
Saturation Ratios: 11.2 % — ABNORMAL LOW (ref 20.0–50.0)
TIBC: 411.6 ug/dL (ref 250.0–450.0)
Transferrin: 294 mg/dL (ref 212.0–360.0)

## 2024-01-17 LAB — CBC
HCT: 33.4 % — ABNORMAL LOW (ref 36.0–46.0)
Hemoglobin: 10.4 g/dL — ABNORMAL LOW (ref 12.0–15.0)
MCHC: 31.2 g/dL (ref 30.0–36.0)
MCV: 77.2 fl — ABNORMAL LOW (ref 78.0–100.0)
Platelets: 246 K/uL (ref 150.0–400.0)
RBC: 4.33 Mil/uL (ref 3.87–5.11)
RDW: 17.1 % — ABNORMAL HIGH (ref 11.5–15.5)
WBC: 5.4 K/uL (ref 4.0–10.5)

## 2024-01-17 LAB — VITAMIN B12: Vitamin B-12: 315 pg/mL (ref 211–911)

## 2024-01-17 NOTE — Patient Instructions (Signed)
 Your provider has requested that you go to the basement level for lab work before leaving today. Press B on the elevator. The lab is located at the first door on the left as you exit the elevator.   Please contact our office when you are ready to schedule your Upper Endoscopy.   Avoid fatty and spicy foods.   _______________________________________________________  If your blood pressure at your visit was 140/90 or greater, please contact your primary care physician to follow up on this.  _______________________________________________________  If you are age 63 or older, your body mass index should be between 23-30. Your Body mass index is 36.75 kg/m. If this is out of the aforementioned range listed, please consider follow up with your Primary Care Provider.  If you are age 33 or younger, your body mass index should be between 19-25. Your Body mass index is 36.75 kg/m. If this is out of the aformentioned range listed, please consider follow up with your Primary Care Provider.   ________________________________________________________  The [facility] providers would like to encourage you to use MYCHART to communicate with providers for non-urgent requests or questions.  Due to long hold times on the telephone, sending your provider a message by [facility] may be a faster and more efficient way to get a response.  Please allow 48 business hours for a response.  Please remember that this is for non-urgent requests.  _______________________________________________________  [facility] is using a team-based approach to care.  Your team is made up of your doctor and two to three APPS. Our APPS (Nurse Practitioners and Physician Assistants) work with your physician to ensure care continuity for you. They are fully qualified to address your health concerns and develop a treatment plan. They communicate directly with your gastroenterologist to care for you. Seeing the Advanced Practice  Practitioners on your physician's team can help you by facilitating care more promptly, often allowing for earlier appointments, access to diagnostic testing, procedures, and other specialty referrals.

## 2024-01-17 NOTE — Progress Notes (Signed)
 01/17/2024 Robin Cox [id number] June 22, 1970   Chief Complaint: Nausea and vomiting   History of Present Illness: Robin Cox is a 53 year old female with a past medical history of anxiety, depression, hypertension, sleep apnea, obesity, IDA and GERD. S/P gastric sleeve surgery 12/2022 and cholecystectomy 07/2015. She was seen in office by [facility] PA-C 11/08/2023 for further evaluation regarding N/V and intermittent epigastric discomfort.She was prescribed Omeprazole  20mg  every day.  An EGD was scheduled on 01/10/2024 but due to concerns regarding cost the patient canceled this procedure. She presents today for further follow up. She denies having any further nausea or vomiting since her office visit 6/13. She also noted, the 2 prior episodes of vomiting occurred after taking OTC meds. She cannot recall which med she took associated with the first episode of vomiting and the second episode of vomiting occurred after taking Sudafed. No heartburn or dysphagia. She describe having intestinal upset since she had her gallbladder removed, shortly after she eats she has an urge to pass a solid or loose stool. No bloody or black stools. Chocolate triggers diarrhea. She has been on [facility] for the past year. She underwent a screening colonoscopy 06/2022 which showed diverticulosis, hypertrophied anal papillae and internal hemorrhoids without polyps.   She has a history of IDA, previously thought to be secondary to menorrhagia with uterine fibroids. Received IV iron infusions about 4 years ago. However, she underwent a hysterectomy 01/2023 and she continues to have anemia. Hg 10.4, iron 32 and B12 level 264 on 11/08/2023.  She stopped taking oral iron. No known family history of esophagea, gastric or colon cancer. Maternal aunt and maternal grandmother had pancreatic cancer.      Latest Ref Rng & Units 11/08/2023    8:56 AM 02/09/2023    3:01 AM 02/05/2023    8:53 AM  CBC  WBC 4.0 - 10.5 K/uL 6.8   13.5  6.8   Hemoglobin 12.0 - 15.0 g/dL 89.5  7.3  9.3   Hematocrit 36.0 - 46.0 % 33.3  25.4  32.5   Platelets 150.0 - 400.0 K/uL 285.0  260  295        Latest Ref Rng & Units 11/08/2023    8:56 AM 02/09/2023    3:01 AM 02/05/2023    8:53 AM  CMP  Glucose 70 - 99 mg/dL 76  880  89   BUN 6 - 23 mg/dL 4  8  7    Creatinine 0.40 - 1.20 mg/dL 9.11  8.90  9.12   Sodium 135 - 145 mEq/L 138  135  135   Potassium 3.5 - 5.1 mEq/L 4.0  4.2  4.0   Chloride 96 - 112 mEq/L 104  104  104   CO2 19 - 32 mEq/L 29  24  25    Calcium 8.4 - 10.5 mg/dL 9.0  8.4  8.6   Total Protein 6.0 - 8.3 g/dL 7.1     Total Bilirubin 0.2 - 1.2 mg/dL 0.2     Alkaline Phos 39 - 117 U/L 53     AST 0 - 37 U/L 14     ALT 0 - 35 U/L 6       Labs 11/08/2023: Iron 32. Ferritin 4.3. TIBC 96.2. B12 level 264. Folate 11.3   GI PROCEDURES:  Colonoscopy 06/2022 for screening - Diverticulosis in the transverse colon and in the left colon.  - Anal papilla( e) were hypertrophied. - Internal hemorrhoids.  - The examination  was otherwise normal.  - No polyps - Repeat 10 years  Past Medical History:  Diagnosis Date   Anemia    pt on iron pills   Anxiety    takes Atarax prn   Depression    resolved per pt   GERD (gastroesophageal reflux disease)    Headache(784.0)    h/a's prior to starting b/p medication - no problems since   Hypertension    no meds since wt loss   Pre-diabetes    history of   Sleep apnea    no CPAP since wt loss   Past Surgical History:  Procedure Laterality Date   CHOLECYSTECTOMY N/A 08/02/2015   Procedure: LAPAROSCOPIC CHOLECYSTECTOMY WITH INTRAOPERATIVE CHOLANGIOGRAM;  Surgeon: Morene Olives, MD;  Location: [facility];  Service: General;  Laterality: N/A;   COLONOSCOPY  07/02/2022   CYSTOSCOPY  02/08/2023   Procedure: CYSTOSCOPY;  Surgeon: Rutherford Gain, MD;  Location: [facility];  Service: Gynecology;;   DILATION AND CURETTAGE OF UTERUS  05/28/1988    ESOPHAGOGASTRODUODENOSCOPY N/A 12/30/2012   Procedure: ESOPHAGOGASTRODUODENOSCOPY (EGD);  Surgeon: Morene ONEIDA Olives, MD;  Location: [facility];  Service: General;  Laterality: N/A;   LAPAROSCOPIC GASTRIC SLEEVE RESECTION N/A 12/30/2012   Procedure: LAPAROSCOPIC GASTRIC SLEEVE RESECTION;  Surgeon: Morene ONEIDA Olives, MD;  Location: [facility];  Service: General;  Laterality: N/A;  Laparoscopic Sleeve Gastrectomy    ROBOTIC ASSISTED LAPAROSCOPIC HYSTERECTOMY AND SALPINGECTOMY N/A 02/08/2023   Procedure: XI ROBOTIC ASSISTED LAPAROSCOPIC HYSTERECTOMY AND BILATERAL SALPINGECTOMY AND LEFT OOPHORECTOMY;  Surgeon: Rutherford Gain, MD;  Location: [facility];  Service: Gynecology;  Laterality: N/A;   Current Outpatient Medications on File Prior to Visit  Medication Sig Dispense Refill   acetaminophen  (TYLENOL ) 500 MG tablet Take 2 tablets (1,000 mg total) by mouth every 6 (six) hours. 30 tablet 0   Cyanocobalamin  (VITAMIN B-12 PO) Take by mouth daily.     loratadine (CLARITIN) 10 MG tablet Take 10 mg by mouth daily.     metroNIDAZOLE  (FLAGYL ) 500 MG tablet Take 1 tablet (500 mg total) by mouth 2 (two) times daily. 14 tablet 0   MULTIPLE VITAMIN PO Take by mouth daily.     omeprazole  (PRILOSEC) 20 MG capsule Take 1 capsule (20 mg total) by mouth daily. 30 capsule 3   Semaglutide-Weight Management (WEGOVY [place]) Inject into the skin.     Ferrous Sulfate  (IRON PO) Take 18 mg by mouth daily.     No current facility-administered medications on file prior to visit.   Allergies  Allergen Reactions   Augmentin  [Amoxicillin -Pot Clavulanate] Nausea And Vomiting   Azithromycin Nausea And Vomiting    Got extremely hot    Current Medications, Allergies, Past Medical History, Past Surgical History, Family History and Social History were reviewed in [employer] record.  Review of Systems:   Constitutional: Negative for fever, sweats, chills or unintentional weight loss.   Respiratory: Negative for shortness of breath.   Cardiovascular: Negative for chest pain, palpitations and leg swelling.  Gastrointestinal: See HPI.  Musculoskeletal: Negative for back pain or muscle aches.  Neurological: Negative for dizziness, headaches or paresthesias.   Physical Exam: There were no vitals taken for this visit. BP 130/80 (BP Location: Left Arm, Patient Position: Sitting, Cuff Size: Normal)   Pulse 74   Ht 5' 1 (1.549 m)   Wt 194 lb 8 oz (88.2 kg)   BMI 36.75 kg/m  Wt Readings from Last 3 Encounters:  01/17/24 194 lb  8 oz (88.2 kg)  11/08/23 198 lb (89.8 kg)  02/08/23 218 lb 12.8 oz (99.2 kg)    General: 53 year old female in no acute distress. Head: Normocephalic and atraumatic. Eyes: No scleral icterus. Conjunctiva pink . Ears: Normal auditory acuity. Mouth: Dentition intact. No ulcers or lesions.  Lungs: Clear throughout to auscultation. Heart: Regular rate and rhythm, no murmur. Abdomen: Soft, nontender and nondistended. No masses or hepatomegaly. Normal bowel sounds x 4 quadrants.  Rectal: Deferred. Musculoskeletal: Symmetrical with no gross deformities. Extremities: No edema. Neurological: Alert oriented x 4. No focal deficits.  Psychological: Alert and cooperative. Normal mood and affect  Assessment and Recommendations:  53 year old female with a history of GERD, well controlled on Omeprazole  20mg  every day  -Eventual EGD -GERD diet -Continue Omeprazole  20mg  QD  Nausea/vomiting x 2 episodes, abated two months ago without recurrence -Patient to contact office if N/V recurs   IDA, previously thought to be secondary to menorrhagia, however, anemia persists despite hysterectomy 01/2023. B12 deficiency may be contributing to anemia. Patient no longer taking oral iron. On oral B12 Q day.No overt GI bleeding. Colonoscopy 06/2022 showed diverticulosis and nonbleeding hemorrhoids. No NSAID use.  -CBC, IBC + ferritin and B12 level  -EGD benefits and risks  discussed including risk with sedation, risk of bleeding, perforation and infection. Patient will schedule EGD when financially feasible.  Colon cancer screening. Colonoscopy 06/2022 showed diverticulosis, no polyps.  -Next screening colonoscopy due 06/2032  Altered bowl pattern, patient passes normal formed or loose stool shortly after eating. Georjean may be a contributing factor.  -Consider trial with Colestid if episodes of loose stools increase  -Avoid fatty and spicy foods   History of sleeve gastrectomy 2014 Recently seen by bariatrics for weight loss and put on [facility] 2024

## 2024-01-20 ENCOUNTER — Other Ambulatory Visit: Payer: Self-pay

## 2024-01-20 ENCOUNTER — Ambulatory Visit: Payer: Self-pay | Admitting: Nurse Practitioner

## 2024-01-20 DIAGNOSIS — D509 Iron deficiency anemia, unspecified: Secondary | ICD-10-CM

## 2024-01-20 MED ORDER — FERROUS SULFATE 325 (65 FE) MG PO TABS: 325.0000 mg | ORAL_TABLET | ORAL | 1 refills | Status: DC

## 2024-01-20 NOTE — Progress Notes (Signed)
 Agree with assessment and plan as outlined.

## 2024-01-23 MED ORDER — FERROUS SULFATE 325 (65 FE) MG PO TABS: 325.0000 mg | ORAL_TABLET | ORAL | 1 refills | Status: AC

## 2024-01-23 NOTE — Telephone Encounter (Signed)
 Patient called back. Explained recent lab results and recommendation for Ferrous Sulfate  on Monday, Wed, Fridays with request to have follow up labs in 6 weeks. She verbalizes understanding. Also reminded patient she should make us  aware when she is ready to schedule endoscopy.

## 2024-03-16 ENCOUNTER — Other Ambulatory Visit: Payer: Self-pay | Admitting: Gastroenterology
